# Patient Record
Sex: Female | Born: 1995
Health system: Southern US, Community
[De-identification: ages and names within clinical notes are randomized; demographics above are authoritative.]

## PROBLEM LIST (undated history)

## (undated) DIAGNOSIS — G43909 Migraine, unspecified, not intractable, without status migrainosus: Secondary | ICD-10-CM

## (undated) DIAGNOSIS — M5416 Radiculopathy, lumbar region: Secondary | ICD-10-CM

## (undated) DIAGNOSIS — G8929 Other chronic pain: Secondary | ICD-10-CM

## (undated) DIAGNOSIS — E559 Vitamin D deficiency, unspecified: Secondary | ICD-10-CM

## (undated) HISTORY — DX: Other chronic pain: G89.29

## (undated) HISTORY — DX: Radiculopathy, lumbar region: M54.16

## (undated) HISTORY — DX: Vitamin D deficiency, unspecified: E55.9

---
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
## 2017-01-19 ENCOUNTER — Emergency Department (HOSPITAL_COMMUNITY)
Admission: EM | Admit: 2017-01-19 | Discharge: 2017-01-19 | Disposition: A | Discharge status: Home / Self Care | Payer: 59 | Attending: Emergency Medicine | Admitting: Emergency Medicine

## 2017-01-19 ENCOUNTER — Emergency Department (HOSPITAL_COMMUNITY): Payer: 59

## 2017-01-19 ENCOUNTER — Encounter (HOSPITAL_COMMUNITY): Payer: Self-pay | Admitting: Emergency Medicine

## 2017-01-19 DIAGNOSIS — M545 Low back pain, unspecified: Secondary | ICD-10-CM

## 2017-01-19 DIAGNOSIS — N390 Urinary tract infection, site not specified: Secondary | ICD-10-CM | POA: Insufficient documentation

## 2017-01-19 DIAGNOSIS — Z975 Presence of (intrauterine) contraceptive device: Secondary | ICD-10-CM | POA: Diagnosis not present

## 2017-01-19 DIAGNOSIS — R1032 Left lower quadrant pain: Secondary | ICD-10-CM

## 2017-01-19 DIAGNOSIS — R102 Pelvic and perineal pain: Secondary | ICD-10-CM

## 2017-01-19 LAB — I-STAT BETA HCG BLOOD, ED (MC, WL, AP ONLY): I-stat hCG, quantitative: <5 m[IU]/mL (ref <5)

## 2017-01-19 LAB — URINALYSIS, ROUTINE W REFLEX MICROSCOPIC
BILIRUBIN URINE: NEGATIVE
GLUCOSE, UA: NEGATIVE mg/dL
Ketones, ur: 5 mg/dL — AB
Nitrite: NEGATIVE
PROTEIN: NEGATIVE mg/dL
Specific Gravity, Urine: 1.018 (ref 1.005–1.030)
pH: 5 (ref 5.0–8.0)

## 2017-01-19 LAB — CBC
HEMATOCRIT: 45.7 % (ref 36.0–46.0)
HEMOGLOBIN: 16.5 g/dL — AB (ref 12.0–15.0)
MCH: 33.5 pg (ref 26.0–34.0)
MCHC: 36.1 g/dL — AB (ref 30.0–36.0)
MCV: 92.9 fL (ref 78.0–100.0)
Platelets: 293 10*3/uL (ref 150–400)
RBC: 4.92 MIL/uL (ref 3.87–5.11)
RDW: 12.1 % (ref 11.5–15.5)
WBC: 10.4 10*3/uL (ref 4.0–10.5)

## 2017-01-19 LAB — COMPREHENSIVE METABOLIC PANEL
ALBUMIN: 5 g/dL (ref 3.5–5.0)
ALK PHOS: 85 U/L (ref 38–126)
ALT: 24 U/L (ref 14–54)
ANION GAP: 8 (ref 5–15)
AST: 28 U/L (ref 15–41)
BILIRUBIN TOTAL: 1.2 mg/dL (ref 0.3–1.2)
BUN: 12 mg/dL (ref 6–20)
CALCIUM: 9.6 mg/dL (ref 8.9–10.3)
CO2: 23 mmol/L (ref 22–32)
Chloride: 106 mmol/L (ref 101–111)
Creatinine, Ser: 0.79 mg/dL (ref 0.44–1.00)
GFR calc Af Amer: >60 mL/min (ref >60)
GFR calc non Af Amer: >60 mL/min (ref >60)
GLUCOSE: 84 mg/dL (ref 65–99)
POTASSIUM: 3.8 mmol/L (ref 3.5–5.1)
Sodium: 137 mmol/L (ref 135–145)
TOTAL PROTEIN: 8.2 g/dL — AB (ref 6.5–8.1)

## 2017-01-19 LAB — LIPASE, BLOOD: Lipase: 23 U/L (ref 11–51)

## 2017-01-19 LAB — POC OCCULT BLOOD, ED: Fecal Occult Bld: NEGATIVE

## 2017-01-19 MED ORDER — SODIUM CHLORIDE 0.9 % IV BOLUS (SEPSIS)
1000.0000 mL | Freq: Once | INTRAVENOUS | Status: AC
  Administered 2017-01-19: 1000 mL via INTRAVENOUS

## 2017-01-19 MED ORDER — FENTANYL CITRATE (PF) 100 MCG/2ML IJ SOLN
50.0000 ug | Freq: Once | INTRAMUSCULAR | Status: AC
Start: 2017-01-19 — End: 2017-01-19
  Administered 2017-01-19: 50 ug via INTRAVENOUS
  Filled 2017-01-19: qty 2

## 2017-01-19 MED ORDER — CEPHALEXIN 500 MG PO CAPS: 500.0000 mg | ORAL_CAPSULE | Freq: Two times a day (BID) | ORAL | 0 refills | Status: AC

## 2017-01-19 NOTE — ED Notes (Signed)
US at bedside

## 2017-01-19 NOTE — ED Provider Notes (Signed)
[facility]-EMERGENCY DEPT Provider Note   CSN: [id number] Arrival date & time: 01/19/17  0740     History   Chief Complaint Chief Complaint  Patient presents with  . Abdominal Pain  . Back Pain    HPI Buck MamJasmine Goldwasser is a 22 y.o. female with no significant past medical history presenting with progressive onset worsening suprapubic/left lower quadrant pain for the last 48 hours.  Patient reports intermittent pain denies any aggravating or alleviating factors.  She reports associated intermittent sharp lower left back pain. Patient had been constipated for a couple days prior and has tried Epson salt with relief of constipation but continues to experience pain.  She since has had multiple episodes of soft stools and noted blood on the tissue.  No blood mixed with the stool or in the toilet bowl.  Denies any fever, nausea, vomiting she does report chills.  LMP was at the beginning of November and was the first in 2 years.  Has had IUD for the last 3 years.  She denies vaginal discharge, pain, bleeding or abnormal odor, dysuria, hematuria, frequency or urgency. No history of kidney stone or ovarian cyst.  HPI  Past Medical History:  Diagnosis Date  . Migraine     There are no active problems to display for this patient.   History reviewed. No pertinent surgical history.  OB History    No data available       Home Medications    Prior to Admission medications   Medication Sig Start Date End Date Taking? Authorizing Provider  ibuprofen (ADVIL,MOTRIN) 200 MG tablet Take 400 mg by mouth daily as needed.   Yes [provider]  cephALEXin (KEFLEX) 500 MG capsule Take 1 capsule (500 mg total) by mouth 2 (two) times daily for 7 days. 01/19/17 01/26/17  Georgiana ShoreMitchell,  B, PA-C    Family History No family history on file.  Social History Social History   Tobacco Use  . Smoking status: Never Smoker  Substance Use Topics  . Alcohol use: No  . Drug  use: Not on file     Allergies   Patient has no known allergies.   Review of Systems Review of Systems  Constitutional: Positive for chills. Negative for fatigue and fever.  Respiratory: Negative for chest tightness and shortness of breath.   Cardiovascular: Negative for chest pain and palpitations.  Gastrointestinal: Positive for abdominal pain, blood in stool and constipation. Negative for rectal pain.       Resolved constipation. Blood on tissue only  Genitourinary: Negative for decreased urine volume, difficulty urinating, dysuria, flank pain, frequency, hematuria, urgency, vaginal bleeding, vaginal discharge and vaginal pain.  Musculoskeletal: Positive for back pain. Negative for myalgias, neck pain and neck stiffness.  Skin: Negative for color change, pallor and rash.  Neurological: Negative for syncope, weakness and headaches.     Physical Exam Updated Vital Signs BP 133/84 (BP Location: Left Arm)   Pulse 77   Temp 98.4 F (36.9 C) (Oral)   Resp 18   SpO2 100%   Physical Exam  Constitutional: She appears well-developed and well-nourished.  Non-toxic appearance. She does not appear ill. No distress.  Afebrile, nontoxic-appearing, lying comfortably in bed in no acute distress.  HENT:  Head: Normocephalic and atraumatic.  Eyes: Conjunctivae are normal.  Neck: Neck supple.  Cardiovascular: Normal rate and regular rhythm.  No murmur heard. Pulmonary/Chest: Effort normal and breath sounds normal. No stridor. No respiratory distress. She has no wheezes. She  has no rales.  Abdominal: Soft. Normal appearance and bowel sounds are normal. She exhibits no distension and no mass. There is tenderness in the suprapubic area and left lower quadrant. There is no rigidity, no rebound, no guarding, no CVA tenderness and no tenderness at McBurney's point.  Patient has discomfort with palpation of suprapubic area and adjacent left lower quadrant  Genitourinary:  Genitourinary Comments:  No hemorrhoid or fissure noted on exam.  hemoccult negative  Musculoskeletal: She exhibits no edema.  Neurological: She is alert.  Skin: Skin is warm and dry. No rash noted. She is not diaphoretic. No cyanosis or erythema. No pallor.  Psychiatric: She has a normal mood and affect.  Nursing note and vitals reviewed.    ED Treatments / Results  Labs (all labs ordered are listed, but only abnormal results are displayed) Labs Reviewed  COMPREHENSIVE METABOLIC PANEL - Abnormal; Notable for the following components:      Result Value   Total Protein 8.2 (*)    All other components within normal limits  CBC - Abnormal; Notable for the following components:   Hemoglobin 16.5 (*)    MCHC 36.1 (*)    All other components within normal limits  URINALYSIS, ROUTINE W REFLEX MICROSCOPIC - Abnormal; Notable for the following components:   APPearance HAZY (*)    Hgb urine dipstick MODERATE (*)    Ketones, ur 5 (*)    Leukocytes, UA MODERATE (*)    Bacteria, UA RARE (*)    Squamous Epithelial / LPF 6-30 (*)    All other components within normal limits  URINE CULTURE  LIPASE, BLOOD  I-STAT BETA HCG BLOOD, ED ([facility], [facility], [facility] ONLY)  POC OCCULT BLOOD, ED    EKG  EKG Interpretation None       Radiology US Transvaginal Non-ob  Result Date: 01/19/2017 CLINICAL DATA:  Initial evaluation for acute left lower quadrant pain for 2 days. EXAM: TRANSABDOMINAL AND TRANSVAGINAL ULTRASOUND OF PELVIS DOPPLER ULTRASOUND OF OVARIES TECHNIQUE: Both transabdominal and transvaginal ultrasound examinations of the pelvis were performed. Transabdominal technique was performed for global imaging of the pelvis including uterus, ovaries, adnexal regions, and pelvic cul-de-sac. It was necessary to proceed with endovaginal exam following the transabdominal exam to visualize the uterus and ovaries. Color and duplex Doppler ultrasound was utilized to evaluate blood flow to the ovaries. COMPARISON:  None. FINDINGS: Uterus  Measurements: 7.2 x 3.9 x 4.7 cm. No fibroids or other mass visualized. Endometrium Thickness: 12.9 mm. No focal abnormality visualized. IUD in appropriate position within the endometrial canal. Right ovary Measurements: 5.2 x 2.6 x 3.6 cm. Right ovarian corpus luteal cyst noted. Otherwise unremarkable without adnexal mass. Left ovary Measurements: 4.2 x 2.0 x 2.5 cm. Normal appearance/no adnexal mass. Pulsed Doppler evaluation of both ovaries demonstrates normal low-resistance arterial and venous waveforms. Other findings Small volume free fluid within the cul-de-sac and right adnexa. IMPRESSION: 1. Right ovarian corpus luteal cyst with small volume free physiologic fluid within the pelvis. 2. Otherwise unremarkable pelvic ultrasound. No other acute abnormality identified. Electronically Signed   By: Rise Mu M.D.   On: 01/19/2017 18:00   US Pelvis Complete  Result Date: 01/19/2017 CLINICAL DATA:  Initial evaluation for acute left lower quadrant pain for 2 days. EXAM: TRANSABDOMINAL AND TRANSVAGINAL ULTRASOUND OF PELVIS DOPPLER ULTRASOUND OF OVARIES TECHNIQUE: Both transabdominal and transvaginal ultrasound examinations of the pelvis were performed. Transabdominal technique was performed for global imaging of the pelvis including uterus, ovaries, adnexal regions, and pelvic cul-de-sac.  It was necessary to proceed with endovaginal exam following the transabdominal exam to visualize the uterus and ovaries. Color and duplex Doppler ultrasound was utilized to evaluate blood flow to the ovaries. COMPARISON:  None. FINDINGS: Uterus Measurements: 7.2 x 3.9 x 4.7 cm. No fibroids or other mass visualized. Endometrium Thickness: 12.9 mm. No focal abnormality visualized. IUD in appropriate position within the endometrial canal. Right ovary Measurements: 5.2 x 2.6 x 3.6 cm. Right ovarian corpus luteal cyst noted. Otherwise unremarkable without adnexal mass. Left ovary Measurements: 4.2 x 2.0 x 2.5 cm. Normal  appearance/no adnexal mass. Pulsed Doppler evaluation of both ovaries demonstrates normal low-resistance arterial and venous waveforms. Other findings Small volume free fluid within the cul-de-sac and right adnexa. IMPRESSION: 1. Right ovarian corpus luteal cyst with small volume free physiologic fluid within the pelvis. 2. Otherwise unremarkable pelvic ultrasound. No other acute abnormality identified. Electronically Signed   By: Rise Mu M.D.   On: 01/19/2017 18:00   [place] Art/ven Flow Abd Pelv Doppler  Result Date: 01/19/2017 CLINICAL DATA:  Initial evaluation for acute left lower quadrant pain for 2 days. EXAM: TRANSABDOMINAL AND TRANSVAGINAL ULTRASOUND OF PELVIS DOPPLER ULTRASOUND OF OVARIES TECHNIQUE: Both transabdominal and transvaginal ultrasound examinations of the pelvis were performed. Transabdominal technique was performed for global imaging of the pelvis including uterus, ovaries, adnexal regions, and pelvic cul-de-sac. It was necessary to proceed with endovaginal exam following the transabdominal exam to visualize the uterus and ovaries. Color and duplex Doppler ultrasound was utilized to evaluate blood flow to the ovaries. COMPARISON:  None. FINDINGS: Uterus Measurements: 7.2 x 3.9 x 4.7 cm. No fibroids or other mass visualized. Endometrium Thickness: 12.9 mm. No focal abnormality visualized. IUD in appropriate position within the endometrial canal. Right ovary Measurements: 5.2 x 2.6 x 3.6 cm. Right ovarian corpus luteal cyst noted. Otherwise unremarkable without adnexal mass. Left ovary Measurements: 4.2 x 2.0 x 2.5 cm. Normal appearance/no adnexal mass. Pulsed Doppler evaluation of both ovaries demonstrates normal low-resistance arterial and venous waveforms. Other findings Small volume free fluid within the cul-de-sac and right adnexa. IMPRESSION: 1. Right ovarian corpus luteal cyst with small volume free physiologic fluid within the pelvis. 2. Otherwise unremarkable pelvic ultrasound.  No other acute abnormality identified. Electronically Signed   By: Rise Mu M.D.   On: 01/19/2017 18:00    Procedures Procedures (including critical care time)  Medications Ordered in ED Medications  sodium chloride 0.9 % bolus 1,000 mL (0 mLs Intravenous Stopped 01/19/17 1633)  fentaNYL (SUBLIMAZE) injection 50 mcg (50 mcg Intravenous Given 01/19/17 1416)     Initial Impression / Assessment and Plan / ED Course  I have reviewed the triage vital signs and the nursing notes.  Pertinent labs & imaging results that were available during my care of the patient were reviewed by me and considered in my medical decision making (see chart for details).    Presenting with 48 hours of progressively worsening suprapubic/left lower quadrant discomfort with associated left-sided back pain.  Had been experiencing constipation for couple days prior which has been relieved with drinking Epsom salt. Back pain is not reproducible.  no CVA tenderness. She is well-appearing, nontoxic and afebrile with normal vital signs.  On exam she has discomfort to palpation of the suprapubic area and left lower quadrant.  UA with moderate leukocytes and some bacteria.  Ordered culture Labs pending Will give IV fluids and provide analgesia and reassess  On reassessment, patient reported significant improvement and was pain-free.  Although she does  states that her pain has been like a stabbing sharp pain that comes and goes. Will order ultrasound to rule out intermittent torsion.  Labs unremarkable, Hemoccult negative.  Ultrasound negative with the exception of a right luteal ovarian cyst.  Patient improved while in the emergency department, normal vital signs, afebrile nontoxic.   Discharge home treatment for UTI and close follow-up with PCP.  Discussed strict return precautions and advised to return to the emergency department if experiencing any new or worsening symptoms. Instructions were understood  and patient agreed with discharge plan.  Final Clinical Impressions(s) / ED Diagnoses   Final diagnoses:  Lower urinary tract infectious disease    ED Discharge Orders        Ordered    cephALEXin (KEFLEX) 500 MG capsule  2 times daily     01/19/17 1832       Gregary Cromer 01/19/17 Verdie Shire, MD 01/20/17 [phone]

## 2017-01-19 NOTE — Discharge Instructions (Signed)
As discussed, take your entire course of antibiotics even if you feel better, stay well-hydrated keeping your urine clear. Follow up with your primary care provider.  Return if symptoms worsen, worsening pain, fever, chills, nausea vomiting diarrhea or new concerning symptoms in the meantime

## 2017-01-19 NOTE — ED Triage Notes (Signed)
Patient c/o lower abd pain and lower back pain for couple days. Reports she was constipated and drank Epsom salt which helped have BM but still having abd pains. Denies any urinary problems.

## 2017-01-21 LAB — URINE CULTURE

## 2018-03-06 IMAGING — US US TRANSVAGINAL NON-OB
1 series · 13 of 25 positions shown · non-contrast
Comparison: None.

CLINICAL DATA: Initial evaluation for acute left lower quadrant
pain for 2 days.

EXAM:
TRANSABDOMINAL AND TRANSVAGINAL ULTRASOUND OF PELVIS
DOPPLER ULTRASOUND OF OVARIES
TECHNIQUE: Both transabdominal and transvaginal ultrasound examinations of the
pelvis were performed. Transabdominal technique was performed for
global imaging of the pelvis including uterus, ovaries, adnexal
regions, and pelvic cul-de-sac.
It was necessary to proceed with endovaginal exam following the
transabdominal exam to visualize the uterus and ovaries. Color and
duplex Doppler ultrasound was utilized to evaluate blood flow to the
ovaries.

[Series 1: us transvaginal non-ob · 0.22mm/px · 13 of 114 slices shown]
[im 1/114]
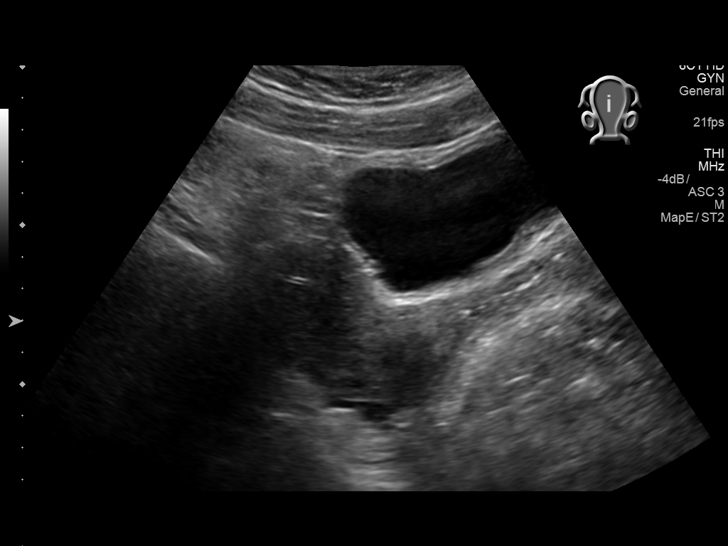
[im 10/114]
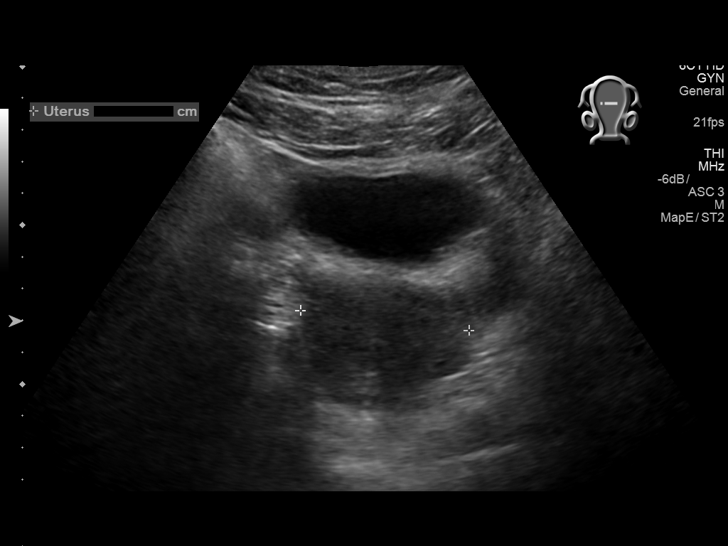
[im 19/114]
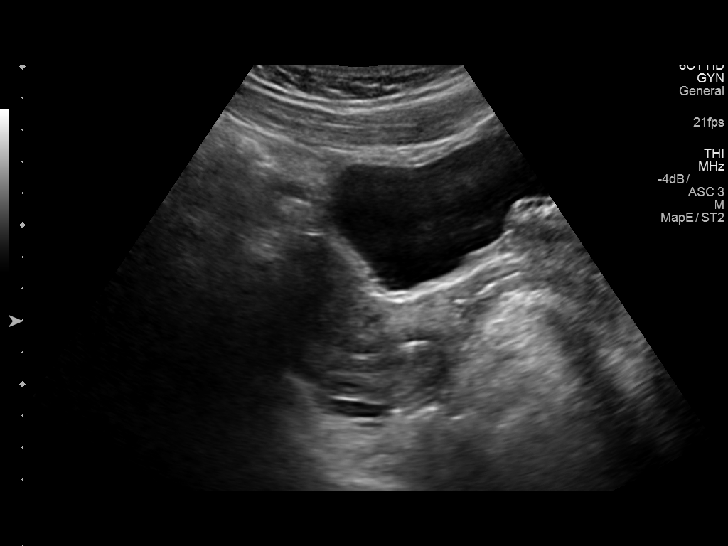
[im 29/114]
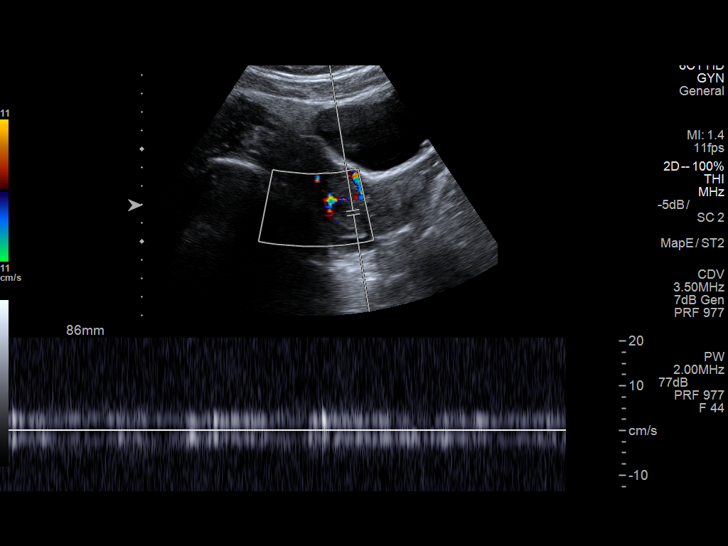
[im 38/114]
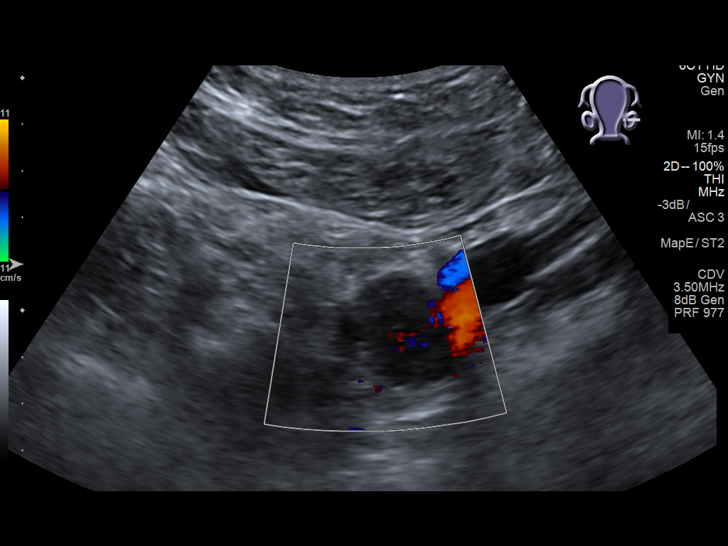
[im 48/114]
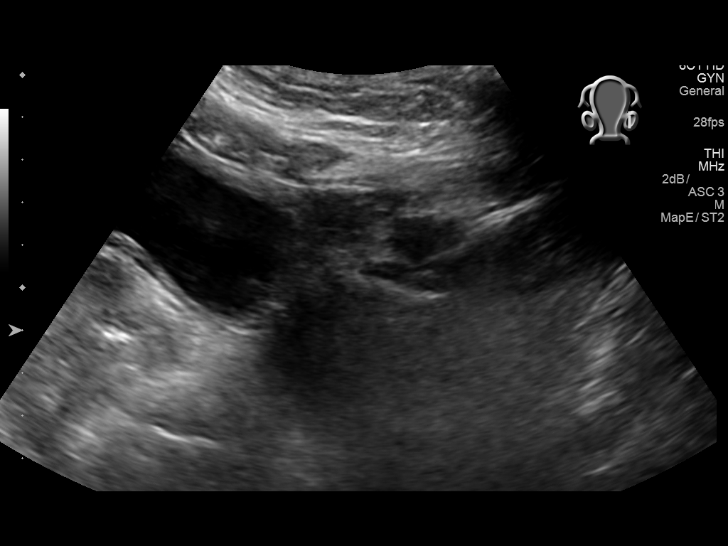
[im 57/114]
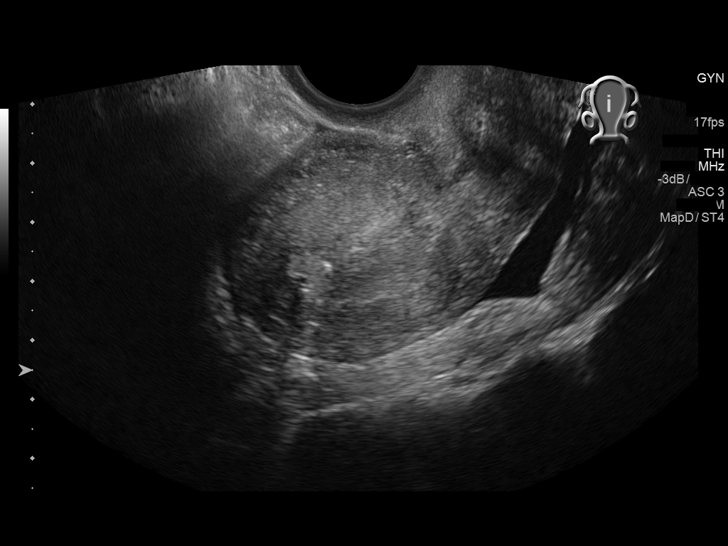
[im 66/114]
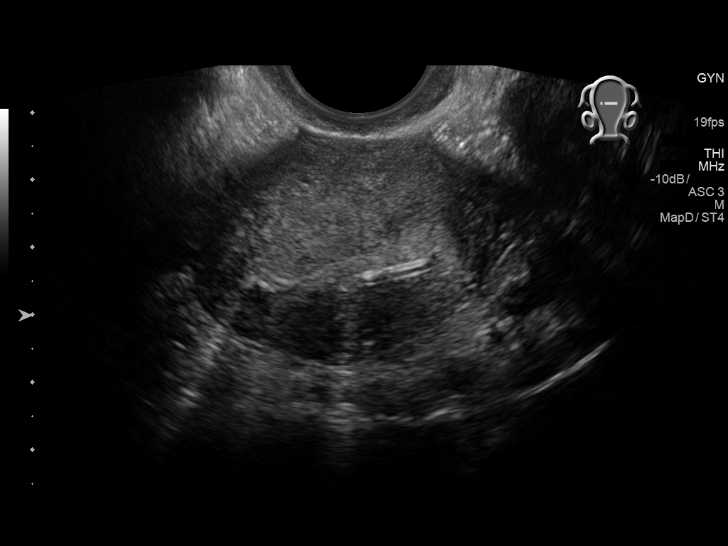
[im 76/114]
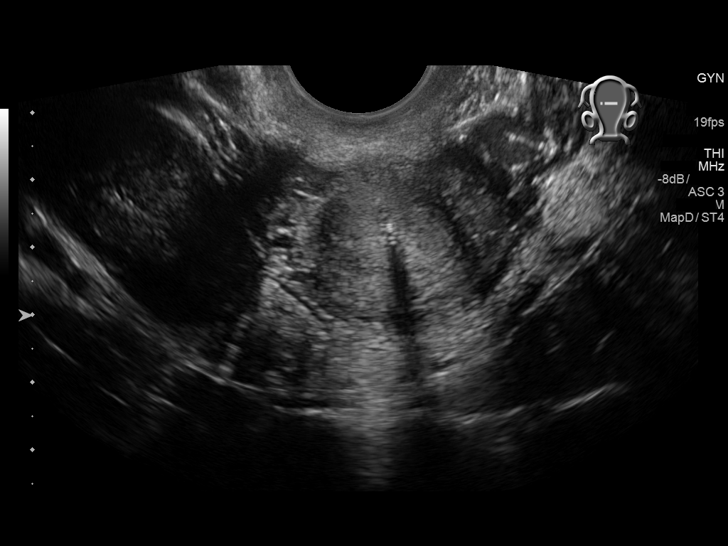
[im 85/114]
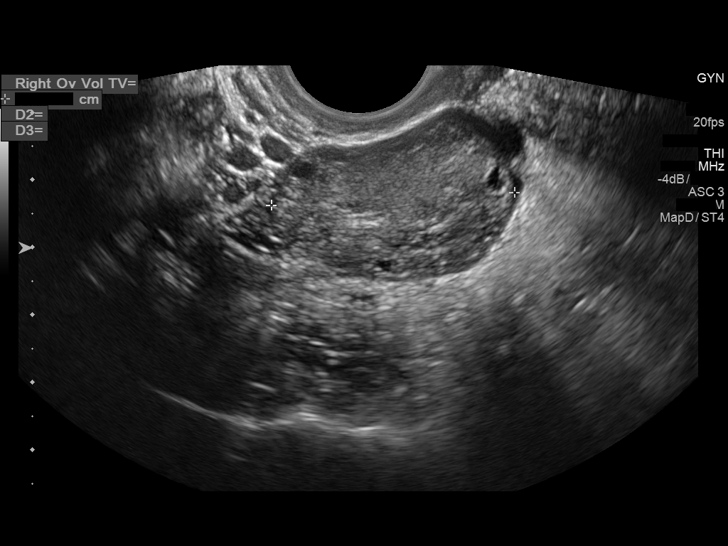
[im 95/114]
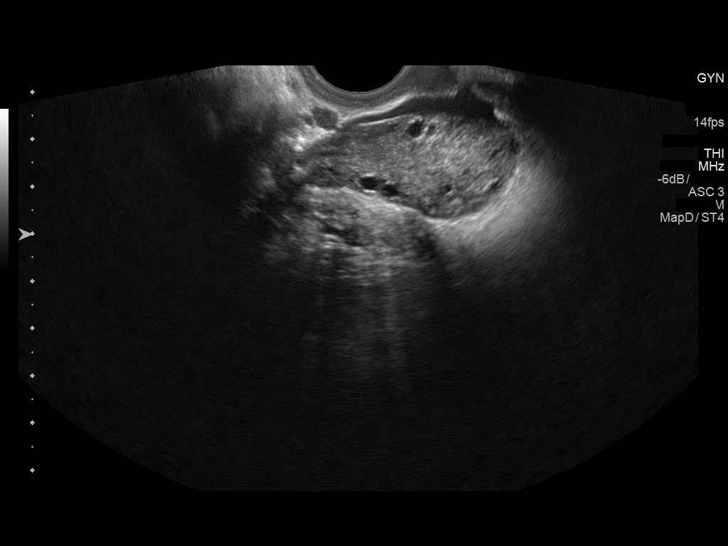
[im 104/114]
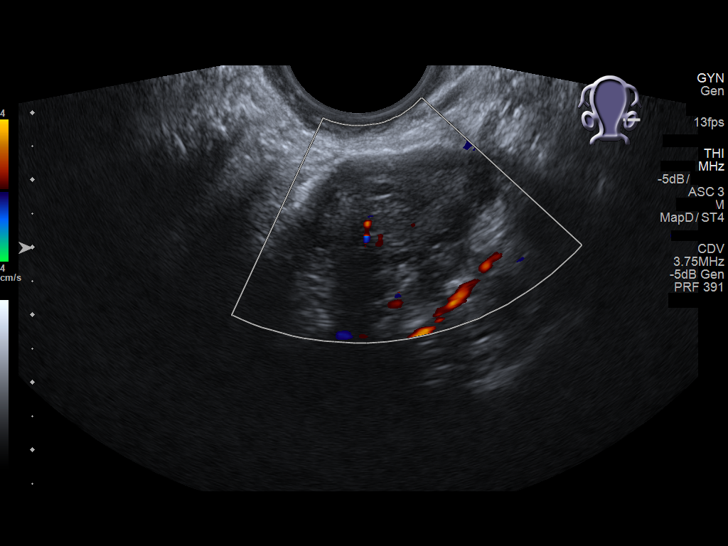
[im 114/114]
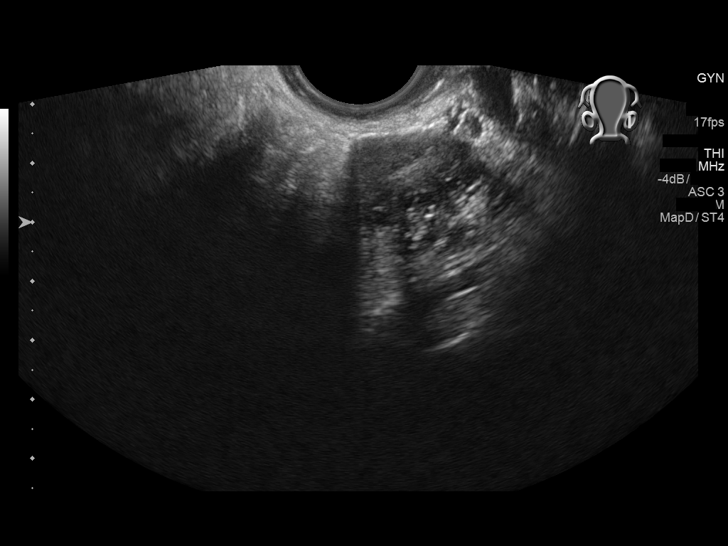

[13 of 25 positions shown; findings below may reference images not displayed]

FINDINGS: Uterus

Measurements: 7.2 x 3.9 x 4.7 cm. No fibroids or other mass
visualized.

Endometrium

Thickness: 12.9 mm. No focal abnormality visualized. IUD in
appropriate position within the endometrial canal.

Right ovary

Measurements: 5.2 x 2.6 x 3.6 cm. Right ovarian corpus luteal cyst
noted. Otherwise unremarkable without adnexal mass.

Left ovary

Measurements: 4.2 x 2.0 x 2.5 cm. Normal appearance/no adnexal mass.

Pulsed Doppler evaluation of both ovaries demonstrates normal
low-resistance arterial and venous waveforms.

Other findings

Small volume free fluid within the cul-de-sac and right adnexa.
IMPRESSION: 1. Right ovarian corpus luteal cyst with small volume free
physiologic fluid within the pelvis.
2. Otherwise unremarkable pelvic ultrasound. No other acute
abnormality identified.

## 2018-10-16 ENCOUNTER — Emergency Department (HOSPITAL_COMMUNITY)
Admission: EM | Admit: 2018-10-16 | Discharge: 2018-10-16 | Disposition: A | Discharge status: Home / Self Care | Payer: 59 | Attending: Emergency Medicine | Admitting: Emergency Medicine

## 2018-10-16 ENCOUNTER — Other Ambulatory Visit: Payer: Self-pay

## 2018-10-16 DIAGNOSIS — N760 Acute vaginitis: Secondary | ICD-10-CM | POA: Insufficient documentation

## 2018-10-16 DIAGNOSIS — B9689 Other specified bacterial agents as the cause of diseases classified elsewhere: Secondary | ICD-10-CM | POA: Diagnosis not present

## 2018-10-16 DIAGNOSIS — R102 Pelvic and perineal pain: Secondary | ICD-10-CM | POA: Diagnosis present

## 2018-10-16 LAB — URINALYSIS, ROUTINE W REFLEX MICROSCOPIC
Bilirubin Urine: NEGATIVE
Glucose, UA: NEGATIVE mg/dL
Ketones, ur: NEGATIVE mg/dL
Leukocytes,Ua: NEGATIVE
Nitrite: NEGATIVE
Protein, ur: NEGATIVE mg/dL
Specific Gravity, Urine: 1.019 (ref 1.005–1.030)
pH: 5 (ref 5.0–8.0)

## 2018-10-16 LAB — WET PREP, GENITAL
Sperm: NONE SEEN
Trich, Wet Prep: NONE SEEN

## 2018-10-16 LAB — POC URINE PREG, ED: Preg Test, Ur: NEGATIVE

## 2018-10-16 MED ORDER — NAPROXEN 500 MG PO TABS: 500.0000 mg | ORAL_TABLET | Freq: Two times a day (BID) | ORAL | 0 refills | Status: DC

## 2018-10-16 MED ORDER — KETOROLAC TROMETHAMINE 30 MG/ML IJ SOLN
30.0000 mg | Freq: Once | INTRAMUSCULAR | Status: AC
  Administered 2018-10-16: 30 mg via INTRAMUSCULAR
  Filled 2018-10-16: qty 1

## 2018-10-16 MED ORDER — METRONIDAZOLE 500 MG PO TABS: 500.0000 mg | ORAL_TABLET | Freq: Two times a day (BID) | ORAL | 0 refills | Status: DC

## 2018-10-16 NOTE — ED Triage Notes (Addendum)
Pt states she woke up with pelvic pain this morning. Hx of dysplasia and pt thinks this feels like similar pain. Pain has gotten better since it started. Has some pain with urination. Also endorses some vaginal discharge this morning but unsure what color.

## 2018-10-16 NOTE — ED Provider Notes (Signed)
[facility] EMERGENCY DEPARTMENT Provider Note   CSN: [id number] Arrival date & time: 10/16/18  3244     History   Chief Complaint Chief Complaint  Patient presents with  . Pelvic Pain    HPI Rebecca Fox is a 23 y.o. female with a past medical history of cervical dysplasia presents to ED for evaluation of pelvic pain for the past several hours.  States that she woke up this morning with stabbing pelvic pain and slight vaginal discharge.  She has not tried any over-the-counter medications to help with her symptoms.  She states the pain woke her up from her sleep.  This feels somewhat similar to the time that she was diagnosed with dysplasia.  States that she had a procedure done to remove the dysplasia about 1 year ago.  She denies concern for STDs, abdominal pain, vomiting, changes to bowel movements, fever or abnormal vaginal bleeding.  States that she has a 5-year IUD for the past 4 years.  Reports some dysuria as well.     HPI  Past Medical History:  Diagnosis Date  . Migraine     There are no active problems to display for this patient.   No past surgical history on file.   OB History   No obstetric history on file.      Home Medications    Prior to Admission medications   Medication Sig Start Date End Date Taking? Authorizing Provider  metroNIDAZOLE (FLAGYL) 500 MG tablet Take 1 tablet (500 mg total) by mouth 2 (two) times daily. 10/16/18   , , PA-C  naproxen (NAPROSYN) 500 MG tablet Take 1 tablet (500 mg total) by mouth 2 (two) times daily. 10/16/18   Dietrich Pates, PA-C    Family History No family history on file.  Social History Social History   Tobacco Use  . Smoking status: Never Smoker  Substance Use Topics  . Alcohol use: No  . Drug use: Not on file     Allergies   Patient has no known allergies.   Review of Systems Review of Systems  Constitutional: Negative for appetite change, chills and fever.  HENT:  Negative for ear pain, rhinorrhea, sneezing and sore throat.   Eyes: Negative for photophobia and visual disturbance.  Respiratory: Negative for cough, chest tightness, shortness of breath and wheezing.   Cardiovascular: Negative for chest pain and palpitations.  Gastrointestinal: Negative for abdominal pain, blood in stool, constipation, diarrhea, nausea and vomiting.  Genitourinary: Positive for dysuria, pelvic pain and vaginal discharge. Negative for hematuria and urgency.  Musculoskeletal: Negative for myalgias.  Skin: Negative for rash.  Neurological: Negative for dizziness, weakness and light-headedness.     Physical Exam Updated Vital Signs BP 115/64 (BP Location: Right Arm)   Pulse 69   Temp 97.7 F (36.5 C) (Oral)   Resp 18   SpO2 100%   Physical Exam Vitals signs and nursing note reviewed. Exam conducted with a chaperone present.  Constitutional:      General: She is not in acute distress.    Appearance: She is well-developed.  HENT:     Head: Normocephalic and atraumatic.     Nose: Nose normal.  Eyes:     General: No scleral icterus.       Right eye: No discharge.        Left eye: No discharge.     Conjunctiva/sclera: Conjunctivae normal.  Neck:     Musculoskeletal: Normal range of motion and neck supple.  Cardiovascular:  Rate and Rhythm: Normal rate and regular rhythm.     Heart sounds: Normal heart sounds. No murmur. No friction rub. No gallop.   Pulmonary:     Effort: Pulmonary effort is normal. No respiratory distress.     Breath sounds: Normal breath sounds.  Abdominal:     General: Bowel sounds are normal. There is no distension.     Palpations: Abdomen is soft.     Tenderness: There is no abdominal tenderness. There is no guarding.  Genitourinary:    Vagina: Vaginal discharge present. No tenderness.     Cervix: No friability or erythema.     Adnexa:        Right: No tenderness.         Left: No tenderness.       Comments: Pelvic exam: normal  external genitalia without evidence of trauma. VULVA: normal appearing vulva with no masses, tenderness or lesion. VAGINA: normal appearing vagina with normal color and thick white vaginal discharge noted. CERVIX: normal appearing cervix without lesions, cervical motion tenderness absent, cervical os closed with out purulent discharge; Wet prep and DNA probe for chlamydia and GC obtained.   ADNEXA: normal adnexa in size, nontender and no masses UTERUS: uterus is normal size, shape, consistency and nontender.   Musculoskeletal: Normal range of motion.  Skin:    General: Skin is warm and dry.     Findings: No rash.  Neurological:     Mental Status: She is alert.     Motor: No abnormal muscle tone.     Coordination: Coordination normal.      ED Treatments / Results  Labs (all labs ordered are listed, but only abnormal results are displayed) Labs Reviewed  WET PREP, GENITAL - Abnormal; Notable for the following components:      Result Value   Yeast Wet Prep HPF POC   (*)    Value: Swab received with less than 0.5 mL of saline, saline added to specimen, interpret results with caution.   Clue Cells Wet Prep HPF POC PRESENT (*)    WBC, Wet Prep HPF POC MODERATE (*)    All other components within normal limits  URINALYSIS, ROUTINE W REFLEX MICROSCOPIC - Abnormal; Notable for the following components:   APPearance HAZY (*)    Hgb urine dipstick SMALL (*)    Bacteria, UA RARE (*)    All other components within normal limits  POC URINE PREG, ED  GC/CHLAMYDIA PROBE AMP ([place]) NOT AT [facility]    EKG None  Radiology No results found.  Procedures Procedures (including critical care time)  Medications Ordered in ED Medications  ketorolac (TORADOL) 30 MG/ML injection 30 mg (30 mg Intramuscular Given 10/16/18 0945)     Initial Impression / Assessment and Plan / ED Course  I have reviewed the triage vital signs and the nursing notes.  Pertinent labs & imaging results that were  available during my care of the patient were reviewed by me and considered in my medical decision making (see chart for details).        23 year old female presents to ED for pelvic pain and vaginal discharge.  Also reports some dysuria.  She had similar but more severe symptoms when she was told she had cervical dysplasia.  She states she had a procedure to have the dysplasia removed about 1 year ago.  She has had repeat Pap smears since then that have been unremarkable.  She has not tried medications today to help with her  symptoms.  Symptoms began today.  Pelvic exam revealed thick white vaginal discharge without any cervical motion tenderness adnexal tenderness or uterine tenderness.  No apparent abnormalities noted on cervix today.  Lab work significant for wet prep showing clue cells.  Urinalysis with some bacteria but otherwise unremarkable.  Pregnancy test is negative.  GC chlamydia probe pending.  Patient was given Toradol with improvement in her symptoms.  Will discharge home with Flagyl, naproxen and OB/GYN follow-up.  Informed patient that we are unable to perform Pap smears here and that this may ultimately be needed due to her history.  Patient agreeable to the plan.  We will have her return for worsening symptoms.  Patient is hemodynamically stable, in NAD, and able to ambulate in the ED. Evaluation does not show pathology that would require ongoing emergent intervention or inpatient treatment. I explained the diagnosis to the patient. Pain has been managed and has no complaints prior to discharge. Patient is comfortable with above plan and is stable for discharge at this time. All questions were answered prior to disposition. Strict return precautions for returning to the ED were discussed. Encouraged follow up with PCP.   An After Visit Summary was printed and given to the patient.   Portions of this note were generated with Scientist, clinical (histocompatibility and immunogenetics)Dragon dictation software. Dictation errors may occur despite  best attempts at proofreading.   Final Clinical Impressions(s) / ED Diagnoses   Final diagnoses:  BV (bacterial vaginosis)    ED Discharge Orders         Ordered    naproxen (NAPROSYN) 500 MG tablet  2 times daily     10/16/18 1048    metroNIDAZOLE (FLAGYL) 500 MG tablet  2 times daily     10/16/18 [address]1048           , , PA-C 10/16/18 1050    Milagros Lollykstra, Richard S, MD 10/17/18 [phone]

## 2018-10-16 NOTE — Discharge Instructions (Signed)
Take the following medications to help with your pain.  Take the Flagyl to help with your infection. Return to the ED if you start to experience worsening symptoms, develop a fever with abdominal pain, inability to tolerate anything by mouth, shortness of breath. It is important for you to follow-up with your OB/GYN for a Pap smear.

## 2018-10-18 LAB — GC/CHLAMYDIA PROBE AMP (~~LOC~~) NOT AT ARMC
Chlamydia: NEGATIVE
Neisseria Gonorrhea: NEGATIVE

## 2019-06-02 ENCOUNTER — Ambulatory Visit (INDEPENDENT_AMBULATORY_CARE_PROVIDER_SITE_OTHER): Payer: 59 | Admitting: Family Medicine

## 2019-06-02 ENCOUNTER — Encounter: Payer: Self-pay | Admitting: Family Medicine

## 2019-06-02 ENCOUNTER — Other Ambulatory Visit: Payer: Self-pay

## 2019-06-02 VITALS — BP 112/88 | HR 86 | Ht 69.0 in | Wt 163.0 lb

## 2019-06-02 DIAGNOSIS — L659 Nonscarring hair loss, unspecified: Secondary | ICD-10-CM | POA: Diagnosis not present

## 2019-06-02 DIAGNOSIS — E559 Vitamin D deficiency, unspecified: Secondary | ICD-10-CM | POA: Diagnosis not present

## 2019-06-02 DIAGNOSIS — Z1321 Encounter for screening for nutritional disorder: Secondary | ICD-10-CM

## 2019-06-02 DIAGNOSIS — Z0000 Encounter for general adult medical examination without abnormal findings: Secondary | ICD-10-CM | POA: Diagnosis not present

## 2019-06-02 LAB — COMPREHENSIVE METABOLIC PANEL
ALT: 9 U/L (ref 0–35)
AST: 16 U/L (ref 0–37)
Albumin: 4.7 g/dL (ref 3.5–5.2)
Alkaline Phosphatase: 65 U/L (ref 39–117)
BUN: 18 mg/dL (ref 6–23)
CO2: 32 mEq/L (ref 19–32)
Calcium: 9.5 mg/dL (ref 8.4–10.5)
Chloride: 105 mEq/L (ref 96–112)
Creatinine, Ser: 0.87 mg/dL (ref 0.40–1.20)
GFR: 79.47 mL/min (ref >60.00)
Glucose, Bld: 83 mg/dL (ref 70–99)
Potassium: 4.1 mEq/L (ref 3.5–5.1)
Sodium: 138 mEq/L (ref 135–145)
Total Bilirubin: 0.6 mg/dL (ref 0.2–1.2)
Total Protein: 6.8 g/dL (ref 6.0–8.3)

## 2019-06-02 LAB — FERRITIN: Ferritin: 24.9 ng/mL (ref 10.0–291.0)

## 2019-06-02 LAB — VITAMIN D 25 HYDROXY (VIT D DEFICIENCY, FRACTURES): VITD: 15.98 ng/mL — ABNORMAL LOW (ref 30.00–100.00)

## 2019-06-02 LAB — CBC WITH DIFFERENTIAL/PLATELET
Basophils Absolute: 0 10*3/uL (ref 0.0–0.1)
Basophils Relative: 0.6 % (ref 0.0–3.0)
Eosinophils Absolute: 0.2 10*3/uL (ref 0.0–0.7)
Eosinophils Relative: 2.1 % (ref 0.0–5.0)
HCT: 42.7 % (ref 36.0–46.0)
Hemoglobin: 14.5 g/dL (ref 12.0–15.0)
Lymphocytes Relative: 50.1 % — ABNORMAL HIGH (ref 12.0–46.0)
Lymphs Abs: 3.7 10*3/uL (ref 0.7–4.0)
MCHC: 33.9 g/dL (ref 30.0–36.0)
MCV: 98.2 fl (ref 78.0–100.0)
Monocytes Absolute: 0.5 10*3/uL (ref 0.1–1.0)
Monocytes Relative: 6.3 % (ref 3.0–12.0)
Neutro Abs: 3 10*3/uL (ref 1.4–7.7)
Neutrophils Relative %: 40.9 % — ABNORMAL LOW (ref 43.0–77.0)
Platelets: 226 10*3/uL (ref 150.0–400.0)
RBC: 4.35 Mil/uL (ref 3.87–5.11)
RDW: 12.4 % (ref 11.5–15.5)
WBC: 7.3 10*3/uL (ref 4.0–10.5)

## 2019-06-02 LAB — TSH: TSH: 1.02 u[IU]/mL (ref 0.35–4.50)

## 2019-06-02 NOTE — Progress Notes (Signed)
Subjective:    Patient ID: Rebecca Fox, female    DOB: [birth year], 24 y.o.   MRN: [id number]  HPI Chief Complaint  Patient presents with  . New Patient (Initial Visit)    Physical --pt stated--bald spot requesting for dermatology.   This is a 24 yo female who presents today to establish care and requests CPE. Lives with her parents and younger brother. 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   Last CPE- sees Dr. Radene Knee, having IUD replaced on Monday.  Pap- Dr. Radene Knee, history of cervical dysplasia with LEEP 07/2017 Tdap-unsure Eye- never Dental- regular Exercise- goes to the gym Sleep- wakes up frequently. Sleeps about 5-6 hours a night.   Poor energy, poor appetite. For awhile. Feels like related to working so much.   Hair loss- Has noticed bald spot behind right ear, covers it with a braid. No itching, pain, rash. increased stress last fall. Doing better now.    Review of Systems  Constitutional: Positive for fatigue. Negative for unexpected weight change.  HENT: Negative.   Eyes: Negative.   Respiratory: Negative.   Cardiovascular: Negative.   Gastrointestinal: Negative.   Endocrine: Negative.   Genitourinary: Negative.   Musculoskeletal: Negative.   Skin:       Hair loss per HPI  Allergic/Immunologic: Negative.   Neurological: Negative.   Hematological: Negative.   Psychiatric/Behavioral: Negative.        Objective:   Physical Exam Vitals reviewed.  Constitutional:      General: She is not in acute distress.    Appearance: Normal appearance. She is normal weight. She is not ill-appearing, toxic-appearing or diaphoretic.  HENT:     Head: Normocephalic.     Right Ear: External ear normal.     Left Ear: External ear normal.     Nose: Nose normal.     Mouth/Throat:     Mouth: Mucous membranes are moist.     Pharynx: Oropharynx is clear.  Cardiovascular:     Rate and Rhythm: Normal rate and regular rhythm.     Heart sounds: Normal  heart sounds.  Pulmonary:     Effort: Pulmonary effort is normal.     Breath sounds: Normal breath sounds.  Abdominal:     General: Abdomen is flat. Bowel sounds are normal.     Palpations: Abdomen is soft.  Musculoskeletal:     Cervical back: Normal range of motion and neck supple. No rigidity or tenderness.     Right lower leg: No edema.     Left lower leg: No edema.  Lymphadenopathy:     Cervical: No cervical adenopathy.  Skin:    General: Skin is warm and dry.     Comments: Approximately 1.5 cm area hair loss above, behind right ear. No rash of scalp, no flaking. Patient with long locs.   Neurological:     Mental Status: She is alert and oriented to person, place, and time.  Psychiatric:        Mood and Affect: Mood normal.        Behavior: Behavior normal.        Thought Content: Thought content normal.        Judgment: Judgment normal.       BP 112/88   Pulse 86   Ht 5\' 9"  (1.753 m)   Wt 163 lb (73.9 kg)   SpO2 98%   BMI 24.07 kg/m  Depression screen [facility] 2/9 06/02/2019  Decreased Interest 0  Down, Depressed, Hopeless 0  PHQ - 2 Score 0  Altered sleeping 3  Tired, decreased energy 1  Change in appetite 1  Feeling bad or failure about yourself  3  Trouble concentrating 1  Moving slowly or fidgety/restless 0  Suicidal thoughts 0  PHQ-9 Score 9  Difficult doing work/chores Somewhat difficult       Assessment & Plan:  1. Annual physical exam - reviewed available records in EMR  2. Hair loss - TSH - CBC with Differential - Ferritin - Comprehensive metabolic panel - Ambulatory referral to Dermatology  3. Encounter for vitamin deficiency screening - Vitamin D, 25-hydroxy  This visit occurred during the SARS-CoV-2 public health emergency.  Safety protocols were in place, including screening questions prior to the visit, additional usage of staff PPE, and extensive cleaning of exam room while observing appropriate contact time as indicated for disinfecting  solutions.    Olean Ree, FNP-BC  [facility], [facility]  06/03/2019 7:19 AM

## 2019-06-02 NOTE — Patient Instructions (Signed)
Good to meet you today  Please work on decreasing your sugary drink intake as well as your nicotine  Continue to exercise regularly and work on stress reduction.

## 2019-06-03 ENCOUNTER — Encounter: Payer: Self-pay | Admitting: Family Medicine

## 2019-06-05 MED ORDER — VITAMIN D3 1.25 MG (50000 UT) PO TABS: 1.0000 | ORAL_TABLET | ORAL | 1 refills | Status: DC

## 2019-06-05 NOTE — Addendum Note (Signed)
Addended by: Olean Ree B on: 06/05/2019 07:02 AM   Modules accepted: Orders

## 2019-09-20 ENCOUNTER — Ambulatory Visit: Payer: 59 | Admitting: Physician Assistant

## 2019-09-20 ENCOUNTER — Other Ambulatory Visit: Payer: Self-pay

## 2019-09-20 ENCOUNTER — Encounter: Payer: Self-pay | Admitting: Physician Assistant

## 2019-09-20 DIAGNOSIS — L639 Alopecia areata, unspecified: Secondary | ICD-10-CM

## 2019-09-20 MED ORDER — CLOBETASOL PROPIONATE 0.05 % EX SOLN: 1.0000 "application " | Freq: Two times a day (BID) | CUTANEOUS | 2 refills | Status: DC

## 2019-09-20 NOTE — Progress Notes (Signed)
   New Patient Visit  Subjective  Rebecca Fox is a 25 y.o. female who presents for the following: Alopecia (bald spots x jan 2021 tx- none). She didn't realize that she was losing hair until the beginning of this year. She feels her hair stops growing at her shoulders and she does have bald spots. She feels like she is generally thinner along with the bald spots. No scalp symptoms. She has an IUD that she has had 5 years and recently had replaced. She feels that she has been stressed. She had a traumatic event last year that caused her to have to move from [facility] for a period of time.    Objective  Well appearing patient in no apparent distress; mood and affect are within normal limits.  A focused examination was performed including scalp, hair. Relevant physical exam findings are noted in the Assessment and Plan.  Objective  Scalp: 2 areas right scalp and top of scalp. Larger is on right. Total loss of hair In these areas but new growth is noted around the outer edge of both lesions.   Assessment & Plan  Alopecia areata Scalp  We discussed the option of topicals vs. Injectable steroid. She is interested in starting with topical and rechecking in 6-8 weeks.  Ordered Medications: clobetasol (TEMOVATE) 0.05 % external solution

## 2019-11-16 ENCOUNTER — Ambulatory Visit: Payer: 59 | Admitting: Physician Assistant

## 2020-08-07 ENCOUNTER — Encounter: Payer: Self-pay | Admitting: Family Medicine

## 2020-08-07 ENCOUNTER — Other Ambulatory Visit: Payer: Self-pay

## 2020-08-07 ENCOUNTER — Ambulatory Visit: Payer: 59 | Admitting: Family Medicine

## 2020-08-07 VITALS — BP 104/68 | HR 80 | Temp 98.1°F | Ht 68.25 in | Wt 158.0 lb

## 2020-08-07 DIAGNOSIS — G5701 Lesion of sciatic nerve, right lower limb: Secondary | ICD-10-CM

## 2020-08-07 NOTE — Progress Notes (Signed)
T. , MD, CAQ Sports Medicine [facility] at [facility] [address] [place], [address]  Phone: [phone]  FAX: [phone]  Rebecca Fox - 25 y.o. female  MRN [id number]  Date of Birth: 12-Jan-1996  Date: 08/07/2020  PCP: Emi Belfast, FNP  Referral: Emi Belfast, FNP  Chief Complaint  Patient presents with   Back Pain    Radiates down her right side through her hip to her knee and sometimes into ankle area     This visit occurred during the SARS-CoV-2 public health emergency.  Safety protocols were in place, including screening questions prior to the visit, additional usage of staff PPE, and extensive cleaning of exam room while observing appropriate contact time as indicated for disinfecting solutions.   Subjective:   Rebecca Fox is a 25 y.o. very pleasant female patient who presents with the following: Back Pain  ongoing for approximately: 6 weeks The patient has had back pain before. The back pain is localized into the l right upper buttocks region predominantly, and she does have some pain going down into her leg.   She does sit at least 5 hours for work, and she often drives every day as well.  R leg is causing her some pain.  Has been putting cream on it and on the lateral hip and buttocks area.  Will come on in the morning.   Does lashes. Irena Cords  + on the R  No bowel or bladder incontinence. No focal weakness. Prior interventions: None Physical therapy: No Chiropractic manipulations: No Acupuncture: No Osteopathic manipulation: No Heat or cold: Minimal effect   Review of Systems is noted in the HPI, as appropriate  Objective:   Blood pressure 104/68, pulse 80, temperature 98.1 F (36.7 C), temperature source Temporal, height 5' 8.25" (1.734 m), weight 158 lb (71.7 kg), SpO2 95 %.  GEN: No acute distress; alert,appropriate. PULM: Breathing comfortably in no respiratory distress PSYCH:  Normally interactive.  ABD: S, NT, ND, + BS, No rebound, No HSM   Range of motion at  the waist: Flexion, rotation and lateral bending: Forward flexion, extension, and all rotational and lateral movements are entirely normal.  No echymosis or edema Rises to examination table with no difficulty Gait: minimally antalgic  Inspection/Deformity: No abnormality Paraspinus T: Modest L4-S1, right greater than left  B Ankle Dorsiflexion (L5,4): 5/5 B Great Toe Dorsiflexion (L5,4): 5/5 Heel Walk (L5): WNL Toe Walk (S1): WNL Rise/Squat (L4): WNL, mild pain  SENSORY B Medial Foot (L4): WNL B Dorsum (L5): WNL B Lateral (S1): WNL Light Touch: WNL Pinprick: WNL  REFLEXES Knee (L4): 2+ Ankle (S1): 2+  B SLR, seated: neg B SLR, supine: neg B FABER: neg B Reverse FABER: Positive She does have tenderness on direct palpation to piriformis musculature on the right B Greater Troch: NT B Log Roll: neg B Stork: Minimally positive on the right B Sciatic Notch: NT  Radiology: No results found.  Assessment and Plan:     ICD-10-CM   1. Piriformis syndrome, right  G57.01      Classic.  Recommended that she get up once per hour to gently stretch of the hip and piriformis.  Using Netter's Orthopaedic Anatomy, reviewed with the patient the structures involved and how they related to diagnosis. The patient indicated understanding.   The patient was given a handout about classic piriformis stretching including [facility], Modified [facility] [facility], my self-described "Sink Stretch," and other piriformis  rehab.  We also reviewed hip flexor and abductor strengthening, ham stretching  Rec deep massage, explained self-massage with ball     Signed,   T. , MD   Outpatient Encounter Medications as of 08/07/2020  Medication Sig   [DISCONTINUED] Cholecalciferol (VITAMIN D3) 1.25 MG (50000 UT) TABS Take 1 tablet by mouth every 7 (seven) days. (Patient not taking: Reported on 09/20/2019)    [DISCONTINUED] clobetasol (TEMOVATE) 0.05 % external solution Apply 1 application topically 2 (two) times daily.   No facility-administered encounter medications on file as of 08/07/2020.

## 2020-08-07 NOTE — Patient Instructions (Signed)
PIRIFORMIS SYNDROME REHAB  Like the pictures on the handout.  1. Work on pretzel stretching, shoulder back and leg draped in front. 3-5 sets, 30 sec.. 2. hip abductor rotations. standing, hip flexion and rotation outward then inward. 3 sets, 15 reps. when can do comfortably, add ankle weights starting at 2 pounds.  3. cross over stretching - shoulder back to ground, same side leg crossover. 3-5 sets for 30 min..   4. SINK STRETCH - YOU CAN DO THIS WHENEVER YOU WANT DURING THE DAY  Tennis ball underneath area in buttocks - on a hard surface underneath  Can also massage this area with an Hydrologist or hand

## 2022-01-13 ENCOUNTER — Encounter: Payer: Self-pay | Admitting: Nurse Practitioner

## 2022-01-13 ENCOUNTER — Ambulatory Visit (HOSPITAL_COMMUNITY)
Admission: RE | Admit: 2022-01-13 | Discharge: 2022-01-13 | Disposition: A | Discharge status: Home / Self Care | Payer: BLUE CROSS/BLUE SHIELD | Source: Ambulatory Visit | Attending: Nurse Practitioner | Admitting: Nurse Practitioner

## 2022-01-13 ENCOUNTER — Ambulatory Visit (INDEPENDENT_AMBULATORY_CARE_PROVIDER_SITE_OTHER): Payer: BLUE CROSS/BLUE SHIELD | Admitting: Nurse Practitioner

## 2022-01-13 VITALS — BP 114/64 | HR 72 | Temp 97.7°F | Ht 70.0 in | Wt 181.0 lb

## 2022-01-13 DIAGNOSIS — Z131 Encounter for screening for diabetes mellitus: Secondary | ICD-10-CM | POA: Diagnosis not present

## 2022-01-13 DIAGNOSIS — E559 Vitamin D deficiency, unspecified: Secondary | ICD-10-CM

## 2022-01-13 DIAGNOSIS — Z13220 Encounter for screening for lipoid disorders: Secondary | ICD-10-CM

## 2022-01-13 DIAGNOSIS — M5441 Lumbago with sciatica, right side: Secondary | ICD-10-CM

## 2022-01-13 DIAGNOSIS — Z0000 Encounter for general adult medical examination without abnormal findings: Secondary | ICD-10-CM

## 2022-01-13 DIAGNOSIS — Z1329 Encounter for screening for other suspected endocrine disorder: Secondary | ICD-10-CM

## 2022-01-13 DIAGNOSIS — G8929 Other chronic pain: Secondary | ICD-10-CM | POA: Insufficient documentation

## 2022-01-13 DIAGNOSIS — F1290 Cannabis use, unspecified, uncomplicated: Secondary | ICD-10-CM

## 2022-01-13 MED ORDER — IBUPROFEN 800 MG PO TABS: 800.0000 mg | ORAL_TABLET | Freq: Three times a day (TID) | ORAL | 0 refills | Status: DC | PRN

## 2022-01-13 NOTE — Assessment & Plan Note (Signed)
Need to avoid smoking marijuana including risk of addiction to illict drugs was discussed.

## 2022-01-13 NOTE — Progress Notes (Signed)
New Patient Office Visit  Subjective:  Patient ID: Rebecca Fox, female    DOB: 1995/10/25  Age: 27 y.o. MRN: [id number]  CC:  Chief Complaint  Patient presents with   Back Pain    Pt stated--lower back pain radiated down back to the  right calf/leg worse when bending down- 2 years.    HPI Rebecca Fox is a 27 y.o. female with past medical history of low back pain, vitamin D deficiency who presents with complaints of chronic low back pain and to establish care with new provider.  Previous PCP Clarene Reamer FNP.    Chronic mid line low back pain with right sided sciatica.  Patient complains intermittent  chronic shooting , stabbing  low back pain, mostly localized to her right upper buttock radiating through her posterior right leg and calf,patient stated that her pain has been going on for the past 2 years. sometimes has tingling sensation on her right foot. She thinks that she had hip dislocation some years ago while playing soccer.  She also does lashes.  Patient denies fever, unintentional weight loss, bladder and bowel incontinence.  She has been taking Tylenol as needed also doing stretching exercises.  She stated that stretching exercises helps some. She currently rates her pain as 4/10.  Pain is worse with sloughing over    Due for flu vaccine and Tdap vaccine need for both vaccines discussed with the patient.  She declined flu vaccine in the office today.  Due for Pap exam patient stated that she will schedule this with her OB/GYN.         Past Medical History:  Diagnosis Date   Migraine     History reviewed. No pertinent surgical history.  Family History  Problem Relation Age of Onset   Breast cancer Paternal Grandmother    Colon cancer Neg Hx    Cervical cancer Neg Hx     Social History   Socioeconomic History   Marital status: Single    Spouse name: Not on file   Number of children: Not on file   Years of education: Not on file   Highest education  level: Not on file  Occupational History   Not on file  Tobacco Use   Smoking status: Never   Smokeless tobacco: Never  Vaping Use   Vaping Use: Never used  Substance and Sexual Activity   Alcohol use: Yes    Comment: occasionally   Drug use: Yes    Types: Marijuana   Sexual activity: Yes  Other Topics Concern   Not on file  Social History Narrative   Lives with her mother   Social Determinants of Health   Financial Resource Strain: Not on file  Food Insecurity: Not on file  Transportation Needs: Not on file  Physical Activity: Not on file  Stress: Not on file  Social Connections: Not on file  Intimate Partner Violence: Not on file    ROS Review of Systems  Constitutional:  Positive for chills. Negative for activity change, appetite change, diaphoresis, fatigue and unexpected weight change.  HENT: Negative.  Negative for congestion, dental problem, drooling and ear discharge.   Eyes:  Negative for pain, redness and itching.  Respiratory: Negative.  Negative for cough, choking, shortness of breath and wheezing.   Cardiovascular: Negative.  Negative for chest pain, palpitations and leg swelling.  Endocrine: Negative.   Genitourinary: Negative.   Musculoskeletal:  Positive for back pain. Negative for joint swelling, neck pain and neck stiffness.  Skin: Negative.   Neurological: Negative.  Negative for dizziness, facial asymmetry, light-headedness and numbness.  Hematological: Negative.   Psychiatric/Behavioral:  Negative for agitation, behavioral problems, confusion, decreased concentration and dysphoric mood.     Objective:   Today's Vitals: BP 114/64   Pulse 72   Temp 97.7 F (36.5 C)   Ht 5' 10" (1.778 m)   Wt 181 lb (82.1 kg)   SpO2 98%   BMI 25.97 kg/m   Physical Exam Vitals and nursing note reviewed. Exam conducted with a chaperone present.  Constitutional:      General: She is not in acute distress.    Appearance: Normal appearance. She is not  ill-appearing or toxic-appearing.  HENT:     Right Ear: Tympanic membrane, ear canal and external ear normal. There is no impacted cerumen.     Left Ear: Tympanic membrane, ear canal and external ear normal. There is no impacted cerumen.     Nose: Nose normal. No congestion or rhinorrhea.     Mouth/Throat:     Mouth: Mucous membranes are moist.     Pharynx: Oropharynx is clear. No oropharyngeal exudate or posterior oropharyngeal erythema.  Eyes:     General: No scleral icterus.       Right eye: No discharge.        Left eye: No discharge.     Extraocular Movements: Extraocular movements intact.     Conjunctiva/sclera: Conjunctivae normal.  Neck:     Vascular: No carotid bruit.  Cardiovascular:     Rate and Rhythm: Normal rate and regular rhythm.     Pulses: Normal pulses.     Heart sounds: Normal heart sounds. No murmur heard.    No friction rub. No gallop.  Pulmonary:     Effort: Pulmonary effort is normal. No respiratory distress.     Breath sounds: Normal breath sounds. No stridor. No wheezing, rhonchi or rales.  Chest:     Chest wall: No mass, lacerations, deformity, swelling, tenderness, crepitus or edema.  Breasts:    Tanner Score is 5.     Right: No swelling, bleeding, inverted nipple, mass, nipple discharge, skin change or tenderness.     Left: No swelling, bleeding, inverted nipple, mass, nipple discharge, skin change or tenderness.  Abdominal:     General: There is no distension.     Palpations: There is no mass.     Tenderness: There is no abdominal tenderness. There is no right CVA tenderness, left CVA tenderness, guarding or rebound.     Hernia: No hernia is present.  Musculoskeletal:        General: Tenderness present. No swelling, deformity or signs of injury.     Cervical back: Normal range of motion and neck supple. No rigidity or tenderness.     Right lower leg: No edema.     Left lower leg: No edema.     Comments: Tenderness of right side  low back area with  range of motion of the spine.  Skin warm and dry no redness or swelling noted.   Lymphadenopathy:     Cervical: No cervical adenopathy.     Upper Body:     Right upper body: No supraclavicular, axillary or pectoral adenopathy.     Left upper body: No supraclavicular, axillary or pectoral adenopathy.  Skin:    General: Skin is warm and dry.     Capillary Refill: Capillary refill takes less than 2 seconds.     Coloration: Skin is not jaundiced  or pale.     Findings: No bruising, erythema or lesion.  Neurological:     Mental Status: She is alert and oriented to person, place, and time.     Cranial Nerves: No cranial nerve deficit.     Sensory: No sensory deficit.     Motor: No weakness.     Coordination: Coordination normal.     Gait: Gait normal.     Deep Tendon Reflexes: Reflexes normal.  Psychiatric:        Mood and Affect: Mood normal.        Behavior: Behavior normal.        Thought Content: Thought content normal.        Judgment: Judgment normal.     Assessment & Plan:   Problem List Items Addressed This Visit       Nervous and Auditory   Chronic midline low back pain with right-sided sciatica     DG HIP UNILAT W OR W/O PELVIS 2-3 VIEWS RIGHT - ibuprofen (ADVIL) 800 MG tablet; Take 1 tablet (800 mg total) by mouth every 8 (eight) hours as needed.  Dispense: 30 tablet; Refill: 0 Please take this medication with meals. You can alternate the medication with tylenol 633m every 6 hours as needed.  - Ambulatory referral to Physical Therapy Stretching exercises and application of heat encouraged. Will refer to orthopedics if conservative management does not help.   She declined toradol and steroid injection today       Relevant Medications   ibuprofen (ADVIL) 800 MG tablet   Other Relevant Orders   DG HIP UNILAT W OR W/O PELVIS 2-3 VIEWS RIGHT   Ambulatory referral to Physical Therapy     Other   Annual physical exam - Primary    Annual exam as documented.   Counseling done include healthy lifestyle involving committing to 150 minutes of exercise per week, heart healthy diet, and attaining healthy weight. The importance of adequate sleep also discussed.  Regular use of seat belt and home safety were also discussed . Immunization and cancer screening  needs are specifically addressed at this visit.  Due for flu vaccine and Tdap vaccines need for both vaccines discussed with the patient today.  Due for Pap exam she will follow-up with her OB/GYN for her Pap exam       Relevant Orders   CBC with Differential   CMP14+EGFR   Marijuana user    Need to avoid smoking marijuana including risk of addiction to illict drugs was discussed.       Other Visit Diagnoses     Screening for diabetes mellitus       Relevant Orders   Hemoglobin A1c   Screening for thyroid disorder       Relevant Orders   TSH   Vitamin D deficiency       Relevant Orders   Vitamin D, 25-hydroxy   Screening for lipid disorders       Relevant Orders   Lipid Panel       Outpatient Encounter Medications as of 01/13/2022  Medication Sig   ibuprofen (ADVIL) 800 MG tablet Take 1 tablet (800 mg total) by mouth every 8 (eight) hours as needed.   No facility-administered encounter medications on file as of 01/13/2022.    Follow-up: Return in about 3 months (around 04/14/2022) for sciatica .   FRenee Rival FNP

## 2022-01-13 NOTE — Patient Instructions (Addendum)
Please consider getting your TDAP vaccine and Flu vaccine  Sciatica of left side  - DG HIP UNILAT W OR W/O PELVIS 2-3 VIEWS RIGHT - ibuprofen (ADVIL) 800 MG tablet; Take 1 tablet (800 mg total) by mouth every 8 (eight) hours as needed.  Dispense: 30 tablet; Refill: 0 Please take this medication with meals. You can alternate the medication with tylenol 650mg  every 6 hours as needed.  - Ambulatory referral to Physical Therapy  It is important that you exercise regularly at least 30 minutes 5 times a week as tolerated  Think about what you will eat, plan ahead. Choose " clean, green, fresh or frozen" over canned, processed or packaged foods which are more sugary, salty and fatty. 70 to 75% of food eaten should be vegetables and fruit. Three meals at set times with snacks allowed between meals, but they must be fruit or vegetables. Aim to eat over a 12 hour period , example 7 am to 7 pm, and STOP after  your last meal of the day. Drink water,generally about 64 ounces per day, no other drink is as healthy. Fruit juice is best enjoyed in a healthy way, by EATING the fruit.  Thanks for choosing [facility] we consider it a privelige to serve you.

## 2022-01-13 NOTE — Assessment & Plan Note (Signed)
Annual exam as documented.  Counseling done include healthy lifestyle involving committing to 150 minutes of exercise per week, heart healthy diet, and attaining healthy weight. The importance of adequate sleep also discussed.  Regular use of seat belt and home safety were also discussed . Immunization and cancer screening  needs are specifically addressed at this visit.  Due for flu vaccine and Tdap vaccines need for both vaccines discussed with the patient today.  Due for Pap exam she will follow-up with her OB/GYN for her Pap exam

## 2022-01-13 NOTE — Assessment & Plan Note (Signed)
DG HIP UNILAT W OR W/O PELVIS 2-3 VIEWS RIGHT - ibuprofen (ADVIL) 800 MG tablet; Take 1 tablet (800 mg total) by mouth every 8 (eight) hours as needed.  Dispense: 30 tablet; Refill: 0 Please take this medication with meals. You can alternate the medication with tylenol 650mg  every 6 hours as needed.  - Ambulatory referral to Physical Therapy Stretching exercises and application of heat encouraged. Will refer to orthopedics if conservative management does not help.   She declined toradol and steroid injection today

## 2022-01-14 LAB — LIPID PANEL
Chol/HDL Ratio: 2.3 ratio (ref 0.0–4.4)
Cholesterol, Total: 135 mg/dL (ref 100–199)
HDL: 58 mg/dL (ref >39)
LDL Chol Calc (NIH): 64 mg/dL (ref 0–99)
Triglycerides: 59 mg/dL (ref 0–149)
VLDL Cholesterol Cal: 13 mg/dL (ref 5–40)

## 2022-01-14 LAB — CBC WITH DIFFERENTIAL/PLATELET
Basophils Absolute: 0.1 10*3/uL (ref 0.0–0.2)
Basos: 1 %
EOS (ABSOLUTE): 0.3 10*3/uL (ref 0.0–0.4)
Eos: 3 %
Hematocrit: 39.6 % (ref 34.0–46.6)
Hemoglobin: 13.7 g/dL (ref 11.1–15.9)
Immature Grans (Abs): 0 10*3/uL (ref 0.0–0.1)
Immature Granulocytes: 0 %
Lymphocytes Absolute: 4.3 10*3/uL — ABNORMAL HIGH (ref 0.7–3.1)
Lymphs: 49 %
MCH: 32.2 pg (ref 26.6–33.0)
MCHC: 34.6 g/dL (ref 31.5–35.7)
MCV: 93 fL (ref 79–97)
Monocytes Absolute: 0.5 10*3/uL (ref 0.1–0.9)
Monocytes: 6 %
Neutrophils Absolute: 3.6 10*3/uL (ref 1.4–7.0)
Neutrophils: 41 %
Platelets: 289 10*3/uL (ref 150–450)
RBC: 4.26 x10E6/uL (ref 3.77–5.28)
RDW: 11 % — ABNORMAL LOW (ref 11.7–15.4)
WBC: 8.8 10*3/uL (ref 3.4–10.8)

## 2022-01-14 LAB — CMP14+EGFR
ALT: 12 IU/L (ref 0–32)
AST: 17 IU/L (ref 0–40)
Albumin/Globulin Ratio: 2.4 — ABNORMAL HIGH (ref 1.2–2.2)
Albumin: 4.6 g/dL (ref 4.0–5.0)
Alkaline Phosphatase: 76 IU/L (ref 44–121)
BUN/Creatinine Ratio: 19 (ref 9–23)
BUN: 14 mg/dL (ref 6–20)
Bilirubin Total: 0.3 mg/dL (ref 0.0–1.2)
CO2: 23 mmol/L (ref 20–29)
Calcium: 9.1 mg/dL (ref 8.7–10.2)
Chloride: 105 mmol/L (ref 96–106)
Creatinine, Ser: 0.75 mg/dL (ref 0.57–1.00)
Globulin, Total: 1.9 g/dL (ref 1.5–4.5)
Glucose: 83 mg/dL (ref 70–99)
Potassium: 4.3 mmol/L (ref 3.5–5.2)
Sodium: 141 mmol/L (ref 134–144)
Total Protein: 6.5 g/dL (ref 6.0–8.5)
eGFR: 112 mL/min/{1.73_m2} (ref >59)

## 2022-01-14 LAB — HEMOGLOBIN A1C
Est. average glucose Bld gHb Est-mCnc: 100 mg/dL
Hgb A1c MFr Bld: 5.1 % (ref 4.8–5.6)

## 2022-01-14 LAB — TSH: TSH: 1.32 u[IU]/mL (ref 0.450–4.500)

## 2022-01-14 LAB — VITAMIN D 25 HYDROXY (VIT D DEFICIENCY, FRACTURES): Vit D, 25-Hydroxy: 18.2 ng/mL — ABNORMAL LOW (ref 30.0–100.0)

## 2022-01-14 NOTE — Progress Notes (Signed)
Normal xray, no evidence of fracture, dislocation or arthritis. Continue current medication and follow up as planned

## 2022-01-14 NOTE — Progress Notes (Signed)
Lymphocytosis. Patient should report fever, weight loss, and night sweats, we will recheck labs at her upcoming appointment.   Vitamin D deficiency. Start taking vitamin D 1000 units daily Foods rich in vitamin D include Cod liver oil,Salmon,Swordfish,Tuna fish,Orange juice fortified with vitamin D,Dairy and plant milks fortified with vitamin D,Sardines,Beef liver . Get early morning sunshine.  Other labs are normal.

## 2022-01-21 NOTE — Therapy (Signed)
OUTPATIENT PHYSICAL THERAPY THORACOLUMBAR EVALUATION   Patient Name: Rebecca Fox MRN: [id number] DOB:1995/01/15, 27 y.o., female Today's Date: 01/23/2022  END OF SESSION:  PT End of Session - 01/23/22 1448     Visit Number 1    Number of Visits 16    Date for PT Re-Evaluation 03/20/22    Progress Note Due on Visit 16    PT Start Time 5035    PT Stop Time 1346    PT Time Calculation (min) 43 min    Activity Tolerance Patient tolerated treatment well;No increased pain;Patient limited by pain    Behavior During Therapy [facility] for tasks assessed/performed             Past Medical History:  Diagnosis Date   Migraine    History reviewed. No pertinent surgical history. Patient Active Problem List   Diagnosis Date Noted   Annual physical exam 01/13/2022   Chronic midline low back pain with right-sided sciatica 01/13/2022   Marijuana user 01/13/2022    PCP: Elby Beck, FNP  REFERRING PROVIDER: Renee Rival, FNP  REFERRING DIAG:  Diagnosis  M54.32 (ICD-10-CM) - Sciatica of left side    Rationale for Evaluation and Treatment: Rehabilitation  THERAPY DIAG:  Abnormal posture - Plan: PT plan of care cert/re-cert  Muscle weakness (generalized) - Plan: PT plan of care cert/re-cert  Other low back pain - Plan: PT plan of care cert/re-cert  Radiculopathy, lumbar region - Plan: PT plan of care cert/re-cert  ONSET DATE: 2 year duration  SUBJECTIVE:                                                                                                                                                                                           SUBJECTIVE STATEMENT: 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  She notes intermittent R sciatica as distal as the foot.  Symptoms are worst with standing for long periods of time at work.  She gets relief with a massage roller and Biofreeze.  PERTINENT HISTORY:   Migranes  PAIN:  Are you having pain? Yes: NPRS scale: 3-6/10 Pain location: Midline low back and right lower extremity as distal as the foot Pain description: Ache, can be sharp with some tingling and paresthesias Aggravating factors: Prolonged standing and flexion Relieving factors: Massage roller and Biofreeze  PRECAUTIONS: Back  WEIGHT BEARING RESTRICTIONS: No  FALLS:  Has patient fallen in last 6 months? 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  PLOF: Independent  PATIENT GOALS: Get back to normal function.  NEXT MD VISIT: NA  OBJECTIVE:   DIAGNOSTIC FINDINGS:  EXAM: DG HIP (WITH OR WITHOUT PELVIS) 3V RIGHT   COMPARISON:  None Available.   FINDINGS: There is no evidence of hip fracture or dislocation. There is no evidence of arthropathy or other focal bone abnormality.   IMPRESSION: Negative.  PATIENT SURVEYS:  FOTO 43 (Goal 53 in 13 visits)  SCREENING FOR RED FLAGS: Bowel or bladder incontinence: No Spinal tumors: No Cauda equina syndrome: No Compression fracture: No  COGNITION: Overall cognitive status: Within functional limits for tasks assessed     SENSATION: Intermittent right leg paresthesia to the toes  MUSCLE LENGTH: Hamstrings: Right 40 deg; Left 40 deg  POSTURE: rounded shoulders, forward head, and decreased lumbar lordosis  LUMBAR ROM:   AROM eval  Flexion   Extension 20  Right lateral flexion   Left lateral flexion   Right rotation   Left rotation    (Blank rows = not tested)  LOWER EXTREMITY ROM:     Passive  Right eval Left eval  Hip flexion 85 90  Hip extension    Hip abduction    Hip adduction    Hip internal rotation 18 17  Hip external rotation 18 25  Knee flexion    Knee extension    Ankle dorsiflexion    Ankle plantarflexion     Ankle inversion    Ankle eversion     (Blank rows = not tested)  LOWER EXTREMITY Strength:    Strength 01/23/2022   Hip flexion    Hip extension    Hip abduction    Hip adduction    Hip internal rotation    Hip external rotation    Spine strength Deferred   Knee extension    Ankle dorsiflexion    Ankle plantarflexion    Ankle inversion    Ankle eversion     (Blank rows = not tested)   GAIT: Distance walked: In the clinic Assistive device utilized: None Level of assistance: Complete Independence Comments: Rebecca Fox notes she does not walk for exercise  TODAY'S TREATMENT:                                                                                                                              DATE: 01/23/2022 Trunk extension AROM 10 x 3 seconds Single knee-to-chest stretch 2 x 20 seconds bilateral Modified Thomas stretch 2 x 20 seconds bilateral Figure 4 stretch supine 4 x 20 seconds bilateral Prone alternating hip extensions 2 sets of 10 for 3 seconds  Functional activities: Reviewed spine anatomy with the spine model, discussed the importance of avoiding flexed postures, reviewed correct logroll and bed mobility   PATIENT EDUCATION:  Education details: See above Person educated: Patient Education method: Explanation, Demonstration, Tactile cues, Verbal cues, and Handouts Education comprehension: verbalized understanding, returned demonstration, verbal  cues required, tactile cues required, and needs further education  HOME EXERCISE PROGRAM: Access Code: [email or web address] URL: [email or web address] Date: 01/23/2022 Prepared by: Rebecca Fox  Exercises - Standing Lumbar Extension at [place]  - 5 x daily - 7 x weekly - 1 sets - 5 reps - 3 seconds hold - Single Knee to Chest Stretch  - 2-3 x daily - 7 x weekly - 1 sets - 5 reps - 20 seconds hold - Modified Thomas Stretch  - 2-3 x daily - 7 x weekly - 5 reps - 20 seconds hold - Supine Figure 4 Piriformis  Stretch  - 2-3 x daily - 7 x weekly - 1 sets - 5 reps - 20 seconds hold - Supine Figure 4 Piriformis Stretch  - 2-3 x daily - 7 x weekly - 1 sets - 5 reps - 20 seconds hold - Prone Hip Extension  - 1 x daily - 7 x weekly - 2-3 sets - 10 reps - 3 seconds hold  ASSESSMENT:  CLINICAL IMPRESSION: Patient is a 27 y.o. female who was seen today for physical therapy evaluation and treatment for low back pain with right sciatica.  Rebecca Fox has a 2-year history of low back pain with right sciatica.  Symptoms are worst at work when she is in flexed postures.  She was started today on an appropriate home exercise program emphasizing postural awareness, correct body mechanics, trunk extension active range of motion to reduce possible lumbar disc and muscle spasm involvement, low back strength and appropriate lower extremity flexibility to help Rebecca Fox meet long-term goals.  OBJECTIVE IMPAIRMENTS: decreased activity tolerance, decreased endurance, decreased knowledge of condition, difficulty walking, decreased ROM, decreased strength, decreased safety awareness, impaired perceived functional ability, increased muscle spasms, impaired flexibility, improper body mechanics, postural dysfunction, and pain.   ACTIVITY LIMITATIONS: bending, standing, and caring for others  PARTICIPATION LIMITATIONS: community activity and occupation  PERSONAL FACTORS:  Migranes  are also affecting patient's functional outcome.   REHAB POTENTIAL: Good  CLINICAL DECISION MAKING: Stable/uncomplicated  EVALUATION COMPLEXITY: Low   GOALS: Goals reviewed with patient? Yes  SHORT TERM GOALS: Target date: 02/20/2022  Improve lumbar extension AROM to 15 degrees Baseline: 5 degrees Goal status: INITIAL  2.  Improve bilateral hip flexion to at least 90 degrees and hip ER AROM to at least 30 degrees Baseline: 85-90 & 18-25 degrees  3.  Rebecca Fox will be able to implement correct body mechanics and to activities of daily  living Baseline: Unable at evaluation Goal status: INITIAL  LONG TERM GOALS: Target date: 03/20/2022  Improve FOTO to 53 in 13 visits Baseline: 43 Goal status: INITIAL  2.  Rebecca Fox will report low back pain consistently less than 3 out of 10 on the visual analog scale with no complaints of right lower extremity pain Baseline: Low back and right lower extremity pain rated 3-6 out of 10 Goal status: INITIAL  3.  Improve lumbar extension AROM to 20 degrees Baseline: 5 degrees Goal status: INITIAL  4.  Improve bilateral lower extremity flexibility for hip flexors to 100, hamstrings to 50 and hip ER AROM to 40 degrees Baseline: 85-90, 40/40 and 18-25 degrees Goal status: INITIAL  5.  Rebecca Fox will be able to maintain the spine strength test position for 30 to 60 seconds Baseline: Unable to assume test postures at evaluation Goal status: INITIAL  6.  Rebecca Fox will be independent with her long-term home exercise program at discharge Baseline: Started 01/23/2022 Goal status: INITIAL  PLAN:  PT  FREQUENCY: 1-2x/week  PT DURATION: 8 weeks  PLANNED INTERVENTIONS: Therapeutic exercises, Therapeutic activity, Gait training, Patient/Family education, Self Care, Joint mobilization, Dry Needling, Spinal mobilization, Cryotherapy, Traction, and Manual therapy.  PLAN FOR NEXT SESSION: Review day 1 home exercise program.  Emphasis on practical body mechanics at work on avoiding flexion.  Continue low back strength progressions and follow-up on home walking prescription compliance.   Cherlyn Cushing, PT, MPT 01/23/2022, 3:11 PM

## 2022-01-23 ENCOUNTER — Encounter: Payer: Self-pay | Admitting: Rehabilitative and Restorative Service Providers"

## 2022-01-23 ENCOUNTER — Ambulatory Visit (INDEPENDENT_AMBULATORY_CARE_PROVIDER_SITE_OTHER): Payer: Self-pay | Admitting: Rehabilitative and Restorative Service Providers"

## 2022-01-23 DIAGNOSIS — M5416 Radiculopathy, lumbar region: Secondary | ICD-10-CM

## 2022-01-23 DIAGNOSIS — R293 Abnormal posture: Secondary | ICD-10-CM

## 2022-01-23 DIAGNOSIS — M6281 Muscle weakness (generalized): Secondary | ICD-10-CM

## 2022-01-23 DIAGNOSIS — M5459 Other low back pain: Secondary | ICD-10-CM

## 2022-02-03 ENCOUNTER — Encounter: Payer: Self-pay | Admitting: Rehabilitative and Restorative Service Providers"

## 2022-02-05 ENCOUNTER — Other Ambulatory Visit: Payer: Self-pay

## 2022-02-05 DIAGNOSIS — G8929 Other chronic pain: Secondary | ICD-10-CM

## 2022-02-05 MED ORDER — IBUPROFEN 800 MG PO TABS: 800.0000 mg | ORAL_TABLET | Freq: Three times a day (TID) | ORAL | 0 refills | Status: DC | PRN

## 2022-02-06 ENCOUNTER — Encounter: Payer: Self-pay | Admitting: Nurse Practitioner

## 2022-02-06 ENCOUNTER — Ambulatory Visit: Payer: Self-pay | Admitting: Nurse Practitioner

## 2022-02-06 ENCOUNTER — Ambulatory Visit (INDEPENDENT_AMBULATORY_CARE_PROVIDER_SITE_OTHER): Payer: Self-pay | Admitting: Nurse Practitioner

## 2022-02-06 VITALS — BP 124/64 | HR 70 | Temp 97.7°F | Wt 181.0 lb

## 2022-02-06 DIAGNOSIS — E559 Vitamin D deficiency, unspecified: Secondary | ICD-10-CM

## 2022-02-06 DIAGNOSIS — M5441 Lumbago with sciatica, right side: Secondary | ICD-10-CM

## 2022-02-06 DIAGNOSIS — G8929 Other chronic pain: Secondary | ICD-10-CM

## 2022-02-06 MED ORDER — CYCLOBENZAPRINE HCL 5 MG PO TABS: 5.0000 mg | ORAL_TABLET | Freq: Every day | ORAL | 0 refills | Status: DC

## 2022-02-06 MED ORDER — METHYLPREDNISOLONE 4 MG PO TBPK: ORAL_TABLET | ORAL | 0 refills | Status: DC

## 2022-02-06 MED ORDER — VITAMIN D (ERGOCALCIFEROL) 1.25 MG (50000 UNIT) PO CAPS: 50000.0000 [IU] | ORAL_CAPSULE | ORAL | 0 refills | Status: DC

## 2022-02-06 MED ORDER — KETOROLAC TROMETHAMINE 60 MG/2ML IM SOLN
60.0000 mg | Freq: Once | INTRAMUSCULAR | Status: AC
  Administered 2022-02-06: 60 mg via INTRAMUSCULAR

## 2022-02-06 MED ORDER — METHYLPREDNISOLONE SODIUM SUCC 40 MG IJ SOLR
40.0000 mg | Freq: Once | INTRAMUSCULAR | Status: AC
  Administered 2022-02-06: 40 mg via INTRAMUSCULAR

## 2022-02-06 NOTE — Progress Notes (Signed)
Acute Office Visit  Subjective:     Patient ID: Rebecca Fox, female    DOB: 20-Aug-1995, 27 y.o.   MRN: [id number]  Chief Complaint  Patient presents with   Leg Pain    HPI [name] presents for follow-up for her chronic low back pain with right-sided sciatica.  She continues to have mid low back pain, cramps in  her right calf worse with standing.  She has started physical therapy.  She reports that physical therapy has been helping some, plans on having therapy done for 6 to 8 weeks.  She has been taking Tylenol as needed as this helps better than ibuprofen.  She denies numbness, tingling, urinary incontinence, fever, chills, shortness of breath, chest pain.   Review of Systems  Respiratory: Negative.  Negative for cough, hemoptysis, sputum production, shortness of breath and wheezing.   Cardiovascular: Negative.  Negative for chest pain, palpitations, orthopnea, claudication and leg swelling.  Genitourinary: Negative.   Musculoskeletal:  Positive for back pain and myalgias.  Neurological:  Negative for dizziness, sensory change, seizures, loss of consciousness and headaches.  Psychiatric/Behavioral:  Negative for depression, memory loss, substance abuse and suicidal ideas. The patient does not have insomnia.         Objective:    BP 124/64   Pulse 70   Temp 97.7 F (36.5 C)   Wt 181 lb (82.1 kg)   SpO2 99%   BMI 25.97 kg/m    Physical Exam Constitutional:      General: She is not in acute distress.    Appearance: Normal appearance. She is not ill-appearing, toxic-appearing or diaphoretic.  Eyes:     General: No scleral icterus.       Right eye: No discharge.        Left eye: No discharge.     Extraocular Movements: Extraocular movements intact.  Cardiovascular:     Rate and Rhythm: Normal rate and regular rhythm.     Pulses: Normal pulses.     Heart sounds: Normal heart sounds. No murmur heard.    No friction rub. No gallop.  Pulmonary:     Effort:  Pulmonary effort is normal. No respiratory distress.     Breath sounds: Normal breath sounds. No stridor. No wheezing, rhonchi or rales.  Chest:     Chest wall: No tenderness.  Abdominal:     Palpations: Abdomen is soft.  Musculoskeletal:     Right lower leg: No edema.     Left lower leg: No edema.     Comments: Mild tenderness on palpation of mid low back, skin warm and dry no swelling or redness noted has palpable pedal pulses.   Skin:    General: Skin is warm and dry.  Neurological:     Mental Status: She is alert and oriented to person, place, and time.     Sensory: No sensory deficit.     Motor: No weakness.     Gait: Gait normal.  Psychiatric:        Mood and Affect: Mood normal.        Behavior: Behavior normal.        Thought Content: Thought content normal.        Judgment: Judgment normal.     No results found for any visits on 02/06/22.      Assessment & Plan:   Problem List Items Addressed This Visit       Nervous and Auditory   Chronic midline low back  pain with right-sided sciatica - Primary     - cyclobenzaprine (FLEXERIL) 5 MG tablet; Take 1 tablet (5 mg total) by mouth at bedtime for muscle spasm - methylPREDNISolone (MEDROL DOSEPAK) 4 MG TBPK tablet; Take as instructed  Toradol 60 mg, Solu-Medrol 40 mg one-time injection given in the office today. Continue physical therapy as planned Take Tylenol 650 mg every 6 hours as needed. Patient encouraged to take Flexeril at bedtime due to side effects of drowsiness       Relevant Medications   cyclobenzaprine (FLEXERIL) 5 MG tablet   methylPREDNISolone (MEDROL DOSEPAK) 4 MG TBPK tablet     Other   Vitamin D deficiency    Reports that she takes vitamin D supplements at home not sure of the strength.  I discussed with the patient that we could take vitamin D 50,000 units once weekly for 6 weeks, Rx sent to the pharmacy.  Getting her vitamin D levels corrected could help her muscle cramps Last vitamin  D Lab Results  Component Value Date   VD25OH 18.2 (L) 01/13/2022         Relevant Medications   Vitamin D, Ergocalciferol, (DRISDOL) 1.25 MG (50000 UNIT) CAPS capsule    Meds ordered this encounter  Medications   cyclobenzaprine (FLEXERIL) 5 MG tablet    Sig: Take 1 tablet (5 mg total) by mouth at bedtime.    Dispense:  30 tablet    Refill:  0   Vitamin D, Ergocalciferol, (DRISDOL) 1.25 MG (50000 UNIT) CAPS capsule    Sig: Take 1 capsule (50,000 Units total) by mouth every 7 (seven) days.    Dispense:  6 capsule    Refill:  0   methylPREDNISolone (MEDROL DOSEPAK) 4 MG TBPK tablet    Sig: Take as instructed    Dispense:  1 each    Refill:  0   methylPREDNISolone sodium succinate (SOLU-MEDROL) 40 mg/mL injection 40 mg   ketorolac (TORADOL) injection 60 mg    No follow-ups on file.  Renee Rival, FNP

## 2022-02-06 NOTE — Assessment & Plan Note (Addendum)
-   cyclobenzaprine (FLEXERIL) 5 MG tablet; Take 1 tablet (5 mg total) by mouth at bedtime for muscle spasm - methylPREDNISolone (MEDROL DOSEPAK) 4 MG TBPK tablet; Take as instructed  Toradol 60 mg, Solu-Medrol 40 mg one-time injection given in the office today. Continue physical therapy as planned Take Tylenol 650 mg every 6 hours as needed. Patient encouraged to take Flexeril at bedtime due to side effects of drowsiness

## 2022-02-06 NOTE — Patient Instructions (Addendum)
1. Chronic midline low back pain with right-sided sciatica - cyclobenzaprine (FLEXERIL) 5 MG tablet; Take 1 tablet (5 mg total) by mouth at bedtime.  Take tylenol 650mg  every 6 hours as needed, Please continue PT as discussed   methylPREDNISolone (MEDROL DOSEPAK) 4 MG TBPK tablet; Take as instructed  Dispense: 1 each; Refill: 0  You were given toradol 60 mg injection and solumedrol 40mg  injection today    2. Vitamin D deficiency  - Vitamin D, Ergocalciferol, (DRISDOL) 1.25 MG (50000 UNIT) CAPS capsule; Take 1 capsule (50,000 Units total) by mouth every 7 (seven) days.  Dispense: 6 capsule; Refill: 0    Thanks for choosing Patient Rebecca Fox we consider it a privelige to serve you.

## 2022-02-06 NOTE — Assessment & Plan Note (Signed)
Reports that she takes vitamin D supplements at home not sure of the strength.  I discussed with the patient that we could take vitamin D 50,000 units once weekly for 6 weeks, Rx sent to the pharmacy.  Getting her vitamin D levels corrected could help her muscle cramps Last vitamin D Lab Results  Component Value Date   VD25OH 18.2 (L) 01/13/2022

## 2022-02-13 ENCOUNTER — Ambulatory Visit (INDEPENDENT_AMBULATORY_CARE_PROVIDER_SITE_OTHER): Payer: BLUE CROSS/BLUE SHIELD | Admitting: Rehabilitative and Restorative Service Providers"

## 2022-02-13 ENCOUNTER — Encounter: Payer: Self-pay | Admitting: Rehabilitative and Restorative Service Providers"

## 2022-02-13 DIAGNOSIS — M6281 Muscle weakness (generalized): Secondary | ICD-10-CM | POA: Diagnosis not present

## 2022-02-13 DIAGNOSIS — M5416 Radiculopathy, lumbar region: Secondary | ICD-10-CM | POA: Diagnosis not present

## 2022-02-13 DIAGNOSIS — M5459 Other low back pain: Secondary | ICD-10-CM | POA: Diagnosis not present

## 2022-02-13 DIAGNOSIS — R293 Abnormal posture: Secondary | ICD-10-CM | POA: Diagnosis not present

## 2022-02-13 NOTE — Therapy (Signed)
OUTPATIENT PHYSICAL THERAPY TREATMENT NOTE   Patient Name: Rebecca Fox MRN: [id number] DOB:1995-11-15, 27 y.o., female Today's Date: 02/13/2022  END OF SESSION:   PT End of Session - 02/13/22 1523     Visit Number 2    Number of Visits 16    Date for PT Re-Evaluation 03/20/22    Progress Note Due on Visit 16    PT Start Time 1431    PT Stop Time 1513    PT Time Calculation (min) 42 min    Activity Tolerance Patient tolerated treatment well;No increased pain;Patient limited by pain    Behavior During Therapy [facility] for tasks assessed/performed             Past Medical History:  Diagnosis Date   Migraine    History reviewed. No pertinent surgical history. Patient Active Problem List   Diagnosis Date Noted   Vitamin D deficiency 02/06/2022   Annual physical exam 01/13/2022   Chronic midline low back pain with right-sided sciatica 01/13/2022   Marijuana user 01/13/2022     THERAPY DIAG:  Abnormal posture  Muscle weakness (generalized)  Other low back pain  Radiculopathy, lumbar region PCP: Elby Beck, FNP   REFERRING PROVIDER: Renee Rival, FNP   REFERRING DIAG:  Diagnosis  M54.32 (ICD-10-CM) - Sciatica of left side      Rationale for Evaluation and Treatment: Rehabilitation   THERAPY DIAG:  Abnormal posture - Plan: PT plan of care cert/re-cert   Muscle weakness (generalized) - Plan: PT plan of care cert/re-cert   Other low back pain - Plan: PT plan of care cert/re-cert   Radiculopathy, lumbar region - Plan: PT plan of care cert/re-cert   ONSET DATE: 2 year duration   SUBJECTIVE:                                                                                                                                                                                            SUBJECTIVE STATEMENT: Rebecca Fox reports being more posturally aware and 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  She notes intermittent R sciatica as  distal as the distal thigh (was to the foot). Symptoms are worst with standing for long periods of time at work.  She gets relief with a massage roller and Biofreeze.   PERTINENT HISTORY:  Migranes   PAIN:  Are you having pain? Yes: NPRS scale: 3-6/10 Pain location: Midline low back and right lower extremity as distal as the foot Pain description: Ache, can be sharp with some tingling and paresthesias Aggravating factors: Prolonged standing and flexion Relieving factors: Massage roller and Biofreeze  PRECAUTIONS: Back   WEIGHT BEARING RESTRICTIONS: No   FALLS:  Has patient fallen in last 6 months? 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   PLOF: Independent   PATIENT GOALS: Get back to normal function.   NEXT MD VISIT: NA   OBJECTIVE:    DIAGNOSTIC FINDINGS:  EXAM: DG HIP (WITH OR WITHOUT PELVIS) 3V RIGHT   COMPARISON:  None Available.   FINDINGS: There is no evidence of hip fracture or dislocation. There is no evidence of arthropathy or other focal bone abnormality.   IMPRESSION: Negative.   PATIENT SURVEYS:  FOTO 56 (Goal 53 in 13 visits)   SCREENING FOR RED FLAGS: Bowel or bladder incontinence: No Spinal tumors: No Cauda equina syndrome: No Compression fracture: No   COGNITION: Overall cognitive status: Within functional limits for tasks assessed                          SENSATION: Intermittent right leg paresthesia to the toes   MUSCLE LENGTH: 02/13/2022 Hamstrings: Right 50 deg; Left 55 deg  01/23/2022 Hamstrings: Right 40 deg; Left 40 deg   POSTURE: rounded shoulders, forward head, and decreased lumbar lordosis   LUMBAR ROM:    AROM 01/23/2022 02/13/2022  Flexion     Extension 5 10  Right lateral flexion     Left lateral flexion     Right rotation     Left rotation       (Blank rows = not tested)   LOWER EXTREMITY ROM:      Passive  Right eval Left eval Left/Right 02/13/2022  Hip flexion 85 90 100/95  Hip extension       Hip abduction       Hip adduction       Hip internal rotation 18 17   Hip external rotation 18 25 39/25  Knee flexion       Knee extension       Ankle dorsiflexion       Ankle plantarflexion       Ankle inversion       Ankle eversion        (Blank rows = not tested)   LOWER EXTREMITY Strength:     Strength 01/23/2022  02/13/2022  Hip flexion      Hip extension      Hip abduction      Hip adduction      Hip internal rotation      Hip external rotation      Spine strength Deferred 14 seconds   Knee extension      Ankle dorsiflexion      Ankle plantarflexion      Ankle inversion      Ankle eversion       (Blank rows = not tested)     GAIT: Distance walked: In the clinic Assistive device utilized: None Level of assistance: Complete Independence Comments: Rebecca Fox notes she does not walk for exercise   TODAY'S TREATMENT:  DATE:  02/13/2022 Trunk extension AROM 10 x 3 seconds Modified Thomas stretch 5 x 20 seconds bilateral Modified figure 4 stretch with knee to chest 5 x 20 seconds bilateral Hamstrings stretch 5X 20 seconds bilateral (other leg straight) Prone alternating hip extensions 2 sets of 10 for 3 seconds Prone alternating arm and leg extension 10 x 3 seconds   Functional activities: Reviewed spine anatomy with the spine model, reviewed the importance of avoiding flexed postures, reviewed correct logroll and bed mobility   01/23/2022 Trunk extension AROM 10 x 3 seconds Single knee-to-chest stretch 2 x 20 seconds bilateral Modified Thomas stretch 2 x 20 seconds bilateral Figure 4 stretch supine 4 x 20 seconds bilateral Prone alternating hip extensions 2 sets of 10 for 3 seconds    Functional activities: Reviewed spine anatomy with the spine model, discussed the importance of avoiding flexed postures, reviewed correct logroll and bed mobility     PATIENT EDUCATION:  Education details: See above Person educated: Patient Education method: Explanation, Demonstration, Tactile cues, Verbal cues, and Handouts Education comprehension: verbalized understanding, returned demonstration, verbal cues required, tactile cues required, and needs further education   HOME EXERCISE PROGRAM: Access Code: [email or web address] URL: [email or web address] Date: 01/23/2022 Prepared by: Vista Mink   Exercises - Standing Lumbar Extension at [place]  - 5 x daily - 7 x weekly - 1 sets - 5 reps - 3 seconds hold - Single Knee to Chest Stretch  - 2-3 x daily - 7 x weekly - 1 sets - 5 reps - 20 seconds hold - Modified Thomas Stretch  - 2-3 x daily - 7 x weekly - 5 reps - 20 seconds hold - Supine Figure 4 Piriformis Stretch  - 2-3 x daily - 7 x weekly - 1 sets - 5 reps - 20 seconds hold - Supine Figure 4 Piriformis Stretch  - 2-3 x daily - 7 x weekly - 1 sets - 5 reps - 20 seconds hold - Prone Hip Extension  - 1 x daily - 7 x weekly - 2-3 sets - 10 reps - 3 seconds hold   ASSESSMENT:   CLINICAL IMPRESSION: Janera notes her peripheral symptoms have not been as distal since starting physical therapy.  Because she has a 2-year history of low back pain with right sciatica, hers will likely be a longer rehabilitation.  Symptoms are worst at work when she is in flexed postures.  Brief reassessment today showed progress with her flexibility and she was able to tolerate positioning for strength testing today whereas she was not at evaluation.  Continued supervised PT will emphasize postural awareness, correct body mechanics, trunk extension active range of motion to reduce possible lumbar disc and muscle spasm involvement, low back strength and appropriate lower extremity flexibility to help  [place] meet long-term goals.   OBJECTIVE IMPAIRMENTS: decreased activity tolerance, decreased endurance, decreased knowledge of condition, difficulty walking, decreased ROM, decreased strength, decreased safety awareness, impaired perceived functional ability, increased muscle spasms, impaired flexibility, improper body mechanics, postural dysfunction, and pain.    ACTIVITY LIMITATIONS: bending, standing, and caring for others   PARTICIPATION LIMITATIONS: community activity and occupation   PERSONAL FACTORS:  Migranes  are also affecting patient's functional outcome.    REHAB POTENTIAL: Good   CLINICAL DECISION MAKING: Stable/uncomplicated   EVALUATION COMPLEXITY: Low     GOALS: Goals reviewed with patient? Yes   SHORT TERM GOALS: Target date: 02/20/2022   Improve lumbar extension AROM to 15 degrees Baseline: 5 degrees Goal status:  Met 02/13/2022   2.  Improve bilateral hip flexion to at least 90 degrees and hip ER AROM to at least 30 degrees Baseline: 85-90 & 18-25 degrees Goal status: Ongoing 02/13/2022  3.  Jailey will be able to implement correct body mechanics and to activities of daily living Baseline: Unable at evaluation Goal status: Ongoing 02/13/2022   LONG TERM GOALS: Target date: 03/20/2022   Improve FOTO to 53 in 13 visits Baseline: 43 Goal status: INITIAL   2.  Lumi will report low back pain consistently less than 3 out of 10 on the visual analog scale with no complaints of right lower extremity pain Baseline: Low back and right lower extremity pain rated 3-6 out of 10 Goal status: Ongoing 02/13/2022   3.  Improve lumbar extension AROM to 20 degrees Baseline: 5 degrees Goal status: Ongoing 02/13/2022   4.  Improve bilateral lower extremity flexibility for hip flexors to 100, hamstrings to 50 and hip ER AROM to 40 degrees Baseline: 85-90, 40/40 and 18-25 degrees Goal status: Ongoing 02/13/2022   5.  Genavive will be able to maintain the spine strength test  position for 30 to 60 seconds Baseline: Unable to assume test postures at evaluation Goal status: Ongoing 02/13/2022   6.  Danah will be independent with her long-term home exercise program at discharge Baseline: Started 01/23/2022 Goal status: Ongoing 02/13/2022   PLAN:   PT FREQUENCY: 1-2x/week   PT DURATION: 8 weeks   PLANNED INTERVENTIONS: Therapeutic exercises, Therapeutic activity, Gait training, Patient/Family education, Self Care, Joint mobilization, Dry Needling, Spinal mobilization, Cryotherapy, Traction, and Manual therapy.   PLAN FOR NEXT SESSION: Review home exercise program.  Emphasis on practical body mechanics at work on avoiding flexion and appropriate low back strength progressions.  Follow-up on home walking prescription compliance.   Cherlyn Cushing, PT, MPT 02/13/2022, 3:32 PM

## 2022-02-20 ENCOUNTER — Encounter: Payer: Self-pay | Admitting: Rehabilitative and Restorative Service Providers"

## 2022-02-27 ENCOUNTER — Ambulatory Visit (INDEPENDENT_AMBULATORY_CARE_PROVIDER_SITE_OTHER): Payer: BLUE CROSS/BLUE SHIELD | Admitting: Rehabilitative and Restorative Service Providers"

## 2022-02-27 ENCOUNTER — Encounter: Payer: Self-pay | Admitting: Rehabilitative and Restorative Service Providers"

## 2022-02-27 DIAGNOSIS — R293 Abnormal posture: Secondary | ICD-10-CM | POA: Diagnosis not present

## 2022-02-27 DIAGNOSIS — M5459 Other low back pain: Secondary | ICD-10-CM | POA: Diagnosis not present

## 2022-02-27 DIAGNOSIS — M6281 Muscle weakness (generalized): Secondary | ICD-10-CM

## 2022-02-27 DIAGNOSIS — M5416 Radiculopathy, lumbar region: Secondary | ICD-10-CM

## 2022-02-27 NOTE — Therapy (Signed)
OUTPATIENT PHYSICAL THERAPY TREATMENT NOTE   Patient Name: Rebecca Fox MRN: [id number] DOB:1995-03-16, 27 y.o., female Today's Date: 02/27/2022  END OF SESSION:   PT End of Session - 02/27/22 1531     Visit Number 3    Number of Visits 16    Date for PT Re-Evaluation 03/20/22    Progress Note Due on Visit 16    PT Start Time 1430    PT Stop Time 1520    PT Time Calculation (min) 50 min    Activity Tolerance Patient tolerated treatment well;No increased pain;Patient limited by fatigue    Behavior During Therapy [facility] for tasks assessed/performed             Past Medical History:  Diagnosis Date   Migraine    History reviewed. No pertinent surgical history. Patient Active Problem List   Diagnosis Date Noted   Vitamin D deficiency 02/06/2022   Annual physical exam 01/13/2022   Chronic midline low back pain with right-sided sciatica 01/13/2022   Marijuana user 01/13/2022     THERAPY DIAG:  Abnormal posture  Other low back pain  Muscle weakness (generalized)  Radiculopathy, lumbar region PCP: Elby Beck, FNP   REFERRING PROVIDER: Renee Rival, FNP   REFERRING DIAG:  Diagnosis  M54.32 (ICD-10-CM) - Sciatica of left side      Rationale for Evaluation and Treatment: Rehabilitation   THERAPY DIAG:  Abnormal posture - Plan: PT plan of care cert/re-cert   Muscle weakness (generalized) - Plan: PT plan of care cert/re-cert   Other low back pain - Plan: PT plan of care cert/re-cert   Radiculopathy, lumbar region - Plan: PT plan of care cert/re-cert   ONSET DATE: 2 year duration   SUBJECTIVE:                                                                                                                                                                                            SUBJECTIVE STATEMENT: Rebecca Fox notes peripheral symptoms are now as distal as the mid-posterior thigh (were as distal the toes at evaluation, as distal as the distal femur 2  weeks ago).  Overall pain has decreased as well. 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  She gets relief with the Biofreeze.   PERTINENT HISTORY:  Migranes   PAIN:  Are you having pain? Yes: NPRS scale: 0-5/10 Pain location: Midline low back and right lower extremity as distal as the mid-thigh (was foot) Pain description: Ache, can be sharp with some tingling and paresthesias Aggravating factors: Prolonged standing and flexion Relieving factors: Massage roller and Biofreeze  PRECAUTIONS: Back   WEIGHT BEARING RESTRICTIONS: No   FALLS:  Has patient fallen in last 6 months? 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   PLOF: Independent   PATIENT GOALS: Get back to normal function.   NEXT MD VISIT: NA   OBJECTIVE:    DIAGNOSTIC FINDINGS:  EXAM:  DG HIP (WITH OR WITHOUT PELVIS) 3V RIGHT   COMPARISON: None Available.   FINDINGS: There is no evidence of hip fracture or dislocation. There is no evidence of arthropathy or other focal bone abnormality.   IMPRESSION: Negative.   PATIENT SURVEYS:  FOTO 81 (Goal 53 in 13 visits)   SCREENING FOR RED FLAGS: Bowel or bladder incontinence: No Spinal tumors: No Cauda equina syndrome: No Compression fracture: No   COGNITION: Overall cognitive status: Within functional limits for tasks assessed                          SENSATION: Intermittent right leg paresthesia to the toes   MUSCLE LENGTH: 02/27/2021 Hamstrings: Right 50 deg; Left 55 deg  02/13/2022 Hamstrings: Right 50 deg; Left 55 deg  01/23/2022 Hamstrings: Right 40 deg; Left 40 deg   POSTURE: rounded shoulders, forward head, and decreased lumbar lordosis   LUMBAR ROM:    AROM 01/23/2022 02/13/2022 02/27/2022  Flexion      Extension 5 10 15  $ Right lateral  flexion      Left lateral flexion      Right rotation      Left rotation       (Blank rows = not tested)   LOWER EXTREMITY ROM:      Passive  Right eval Left eval Left/Right 02/13/2022 Left/Right 02/27/2022  Hip flexion 85 90 100/95 105/110  Hip extension        Hip abduction        Hip adduction        Hip internal rotation 18 17  10/20  Hip external rotation 18 25 39/25 38/36  Knee flexion        Knee extension        Ankle dorsiflexion        Ankle plantarflexion        Ankle inversion        Ankle eversion         (Blank rows = not tested)   LOWER EXTREMITY Strength:     Strength 01/23/2022  02/13/2022 02/27/2022  Hip flexion       Hip extension       Hip abduction       Hip adduction       Hip internal rotation       Hip external rotation       Spine strength Deferred 14 seconds  45 seconds  Knee extension       Ankle dorsiflexion       Ankle plantarflexion       Ankle inversion       Ankle eversion        (Blank rows = not tested)     GAIT: Distance walked: In the clinic Assistive device utilized: None Level of assistance: Complete Independence Comments: Rebecca Fox notes she does not walk for exercise   TODAY'S TREATMENT:  DATE:  02/27/2022 Trunk extension AROM 10 x 3 seconds Modified Thomas stretch 4 x 20 seconds bilateral Modified figure 4 stretch with knee to chest 4 x 20 seconds bilateral Hamstrings stretch 4X 20 seconds bilateral (other leg straight) Prone alternating hip extensions 10 for 3 seconds Prone alternating arm and leg extension 10 x 5 seconds Prone superwoman 10X 5 seconds Hip hike in door frame 2 sets of 5 for 3 seconds   Functional activities: Reviewed the importance of avoiding flexed postures, reviewed correct logroll and bed mobility, golfer's and diagonal squat lifts, discussed driving posture and lumbar roll  use.   02/13/2022 Trunk extension AROM 10 x 3 seconds Modified Thomas stretch 5 x 20 seconds bilateral Modified figure 4 stretch with knee to chest 5 x 20 seconds bilateral Hamstrings stretch 5X 20 seconds bilateral (other leg straight) Prone alternating hip extensions 2 sets of 10 for 3 seconds Prone alternating arm and leg extension 10 x 3 seconds   Functional activities: Reviewed spine anatomy with the spine model, reviewed the importance of avoiding flexed postures, reviewed correct logroll and bed mobility   01/23/2022 Trunk extension AROM 10 x 3 seconds Single knee-to-chest stretch 2 x 20 seconds bilateral Modified Thomas stretch 2 x 20 seconds bilateral Figure 4 stretch supine 4 x 20 seconds bilateral Prone alternating hip extensions 2 sets of 10 for 3 seconds   Functional activities: Reviewed spine anatomy with the spine model, discussed the importance of avoiding flexed postures, reviewed correct logroll and bed mobility     PATIENT EDUCATION:  Education details: See above Person educated: Patient Education method: Explanation, Demonstration, Tactile cues, Verbal cues, and Handouts Education comprehension: verbalized understanding, returned demonstration, verbal cues required, tactile cues required, and needs further education   HOME EXERCISE PROGRAM: Access Code: [email or web address] URL: [email or web address] Date: 02/27/2022 Prepared by: Vista Mink  Exercises - Standing Lumbar Extension at [place]  - 5 x daily - 7 x weekly - 1 sets - 5 reps - 3 seconds hold - Single Knee to Chest Stretch  - 1-2 x daily - 1 x weekly - 1 sets - 5 reps - 20 seconds hold - Modified Thomas Stretch  - 1-2 x daily - 7 x weekly - 5 reps - 20 seconds hold - Supine Figure 4 Piriformis Stretch  - 1-2 x daily - 7 x weekly - 1 sets - 5 reps - 20 seconds hold - Prone Hip Extension  - 1 x daily - 1 x weekly - 2 sets - 10 reps - 3 seconds hold - Supine Hamstring Stretch  - 1-2 x daily  - 7 x weekly - 1 sets - 5 reps - 20 seconds hold - Prone Alternating Arm and Leg Lifts  - 1 x daily - 7 x weekly - 2 sets - 10 reps - 5-10 seconds hold - Full Superman on Table  - 1 x daily - 7 x weekly - 1 sets - 10 reps - 5-10 seconds hold - Standing Hip Hiking  - 5 x daily - 7 x weekly - 1 sets - 5 reps - 3 seconds hold   ASSESSMENT:   CLINICAL IMPRESSION: Rebecca Fox notes her peripheral symptoms continue to be less distal since starting physical therapy.  Because she has a 2-year history of low back pain with right sciatica, hers will likely be a longer rehabilitation.  Symptoms are worst at work when she is in flexed postures.  Brief reassessment today showed continued progress with her flexibility, AROM  and spine strength.  Continued supervised PT will reduce possible lumbar disc and muscle spasm involvement, address low back strength and appropriate lower extremity flexibility to help Rebecca Fox meet long-term goals.   OBJECTIVE IMPAIRMENTS: decreased activity tolerance, decreased endurance, decreased knowledge of condition, difficulty walking, decreased ROM, decreased strength, decreased safety awareness, impaired perceived functional ability, increased muscle spasms, impaired flexibility, improper body mechanics, postural dysfunction, and pain.    ACTIVITY LIMITATIONS: bending, standing, and caring for others   PARTICIPATION LIMITATIONS: community activity and occupation   PERSONAL FACTORS:  Migranes  are also affecting patient's functional outcome.    REHAB POTENTIAL: Good   CLINICAL DECISION MAKING: Stable/uncomplicated   EVALUATION COMPLEXITY: Low     GOALS: Goals reviewed with patient? Yes   SHORT TERM GOALS: Target date: 02/20/2022   Improve lumbar extension AROM to 15 degrees Baseline: 5 degrees Goal status: Met 02/13/2022   2.  Improve bilateral hip flexion to at least 90 degrees and hip ER AROM to at least 30 degrees Baseline: 85-90 & 18-25 degrees Goal status: Met  02/27/2022  3.  Rebecca Fox will be able to implement correct body mechanics and to activities of daily living Baseline: Unable at evaluation Goal status: Ongoing 02/27/2022   LONG TERM GOALS: Target date: 03/20/2022   Improve FOTO to 53 in 13 visits Baseline: 43 Goal status: INITIAL   2.  Rebecca Fox will report low back pain consistently less than 3 out of 10 on the visual analog scale with no complaints of right lower extremity pain Baseline: Low back and right lower extremity pain rated 3-6 out of 10 Goal status: Ongoing 02/27/2022   3.  Improve lumbar extension AROM to 20 degrees Baseline: 5 degrees Goal status: Ongoing 02/27/2022   4.  Improve bilateral lower extremity flexibility for hip flexors to 100, hamstrings to 50 and hip ER AROM to 40 degrees Baseline: 85-90, 40/40 and 18-25 degrees Goal status: Partially Met 02/27/2022   5.  Rebecca Fox will be able to maintain the spine strength test position for 30 to 60 seconds Baseline: Unable to assume test postures at evaluation Goal status: Met (45) 02/27/2022   6.  Rebecca Fox will be independent with her long-term home exercise program at discharge Baseline: Started 01/23/2022 Goal status: Ongoing 02/27/2022   PLAN:   PT FREQUENCY: 1-2x/week   PT DURATION: 8 weeks   PLANNED INTERVENTIONS: Therapeutic exercises, Therapeutic activity, Gait training, Patient/Family education, Self Care, Joint mobilization, Dry Needling, Spinal mobilization, Cryotherapy, Traction, and Manual therapy.   PLAN FOR NEXT SESSION: Review changes/progressions to her home exercise program.  Emphasis on practical body mechanics at work on avoiding flexion and appropriate low back strength progressions.  FOTO.   Farley Ly, PT, MPT 02/27/2022, 3:41 PM

## 2022-03-05 ENCOUNTER — Other Ambulatory Visit: Payer: Self-pay | Admitting: Nurse Practitioner

## 2022-03-05 DIAGNOSIS — G8929 Other chronic pain: Secondary | ICD-10-CM

## 2022-03-06 ENCOUNTER — Telehealth: Payer: Self-pay

## 2022-03-06 ENCOUNTER — Encounter: Payer: BLUE CROSS/BLUE SHIELD | Admitting: Rehabilitative and Restorative Service Providers"

## 2022-03-09 ENCOUNTER — Telehealth: Payer: Self-pay

## 2022-03-09 ENCOUNTER — Other Ambulatory Visit: Payer: Self-pay | Admitting: Nurse Practitioner

## 2022-03-09 DIAGNOSIS — G8929 Other chronic pain: Secondary | ICD-10-CM

## 2022-03-09 MED ORDER — CYCLOBENZAPRINE HCL 5 MG PO TABS: 5.0000 mg | ORAL_TABLET | Freq: Every day | ORAL | 0 refills | Status: DC

## 2022-03-13 ENCOUNTER — Encounter: Payer: BLUE CROSS/BLUE SHIELD | Admitting: Rehabilitative and Restorative Service Providers"

## 2022-03-13 DIAGNOSIS — Z419 Encounter for procedure for purposes other than remedying health state, unspecified: Secondary | ICD-10-CM | POA: Diagnosis not present

## 2022-03-16 NOTE — Telephone Encounter (Signed)
Done. [place]

## 2022-03-16 NOTE — Telephone Encounter (Signed)
Done. [place]

## 2022-03-18 ENCOUNTER — Other Ambulatory Visit: Payer: Self-pay | Admitting: Nurse Practitioner

## 2022-03-18 ENCOUNTER — Telehealth: Payer: Self-pay

## 2022-03-18 ENCOUNTER — Telehealth: Payer: Self-pay | Admitting: Nurse Practitioner

## 2022-03-18 DIAGNOSIS — G8929 Other chronic pain: Secondary | ICD-10-CM

## 2022-03-18 MED ORDER — CYCLOBENZAPRINE HCL 5 MG PO TABS: 5.0000 mg | ORAL_TABLET | Freq: Every evening | ORAL | 0 refills | Status: DC | PRN

## 2022-03-18 NOTE — Telephone Encounter (Signed)
Medication refilled per provider. [place]

## 2022-03-18 NOTE — Telephone Encounter (Signed)
Caller & Relationship to patient:  MRN #  [id number]   Call Back Number:   Date of Last Office Visit: 03/09/2022     Date of Next Office Visit: 04/21/2022    Medication(s) to be Refilled: Muscle relaxer  Preferred Pharmacy:   ** Please notify patient to allow 48-72 hours to process** **Let patient know to contact pharmacy at the end of the day to make sure medication is ready. ** **If patient has not been seen in a year or longer, book an appointment **Advise to use MyChart for refill requests OR to contact their pharmacy

## 2022-03-19 NOTE — Telephone Encounter (Signed)
Done

## 2022-03-20 ENCOUNTER — Encounter: Payer: Self-pay | Admitting: Rehabilitative and Restorative Service Providers"

## 2022-03-20 ENCOUNTER — Ambulatory Visit (INDEPENDENT_AMBULATORY_CARE_PROVIDER_SITE_OTHER): Payer: BLUE CROSS/BLUE SHIELD | Admitting: Rehabilitative and Restorative Service Providers"

## 2022-03-20 DIAGNOSIS — M5459 Other low back pain: Secondary | ICD-10-CM | POA: Diagnosis not present

## 2022-03-20 DIAGNOSIS — M5416 Radiculopathy, lumbar region: Secondary | ICD-10-CM | POA: Diagnosis not present

## 2022-03-20 DIAGNOSIS — R293 Abnormal posture: Secondary | ICD-10-CM | POA: Diagnosis not present

## 2022-03-20 DIAGNOSIS — M6281 Muscle weakness (generalized): Secondary | ICD-10-CM

## 2022-03-20 NOTE — Therapy (Addendum)
OUTPATIENT PHYSICAL THERAPY TREATMENT NOTE & RECERTIFICATION   /DISCHARGE   Patient Name: Rebecca Fox MRN: [id number] DOB:[birth year], 27 y.o., female Today's Date: 03/20/2022  END OF SESSION:   PT End of Session - 03/20/22 1519     Visit Number 4    Number of Visits 16    Date for PT Re-Evaluation 03/20/22    Progress Note Due on Visit 16    PT Start Time 1517    PT Stop Time 1601    PT Time Calculation (min) 44 min    Activity Tolerance Patient tolerated treatment well;No increased pain    Behavior During Therapy WFL for tasks assessed/performed              Past Medical History:  Diagnosis Date   Migraine    History reviewed. No pertinent surgical history. Patient Active Problem List   Diagnosis Date Noted   Vitamin D deficiency 02/06/2022   Annual physical exam 01/13/2022   Chronic midline low back pain with right-sided sciatica 01/13/2022   Marijuana user 01/13/2022     THERAPY DIAG:  Abnormal posture - Plan: PT plan of care cert/re-cert  Other low back pain - Plan: PT plan of care cert/re-cert  Muscle weakness (generalized) - Plan: PT plan of care cert/re-cert  Radiculopathy, lumbar region - Plan: PT plan of care cert/re-cert PCP: Emi Belfast, FNP   REFERRING PROVIDER: Donell Beers, FNP   REFERRING DIAG:  Diagnosis  M54.32 (ICD-10-CM) - Sciatica of left side      Rationale for Evaluation and Treatment: Rehabilitation   THERAPY DIAG:  Abnormal posture - Plan: PT plan of care cert/re-cert   Muscle weakness (generalized) - Plan: PT plan of care cert/re-cert   Other low back pain - Plan: PT plan of care cert/re-cert   Radiculopathy, lumbar region - Plan: PT plan of care cert/re-cert   ONSET DATE: 2 year duration   SUBJECTIVE:                                                                                                                                                                                            SUBJECTIVE  STATEMENT: Rebecca Fox notes peripheral symptoms in the right  posterior thigh.  Symptoms are less intense but slightly more distal.  Symptoms are as distal as the distal-posterior thigh (were as distal the toes at evaluation).  Overall pain has decreased as well.  She notes a bit more discomfort the past few days after some awkward and heavier lifting at work.   PERTINENT HISTORY:  Migranes   PAIN:  Are you having pain? Yes: NPRS scale: 0-3/10 low  back and right posterior thigh 0-4/10 this week Pain location: Midline low back and right lower extremity as distal as the mid-thigh (was foot) Pain description: Ache, can be sharp with some tingling and paresthesias Aggravating factors: Prolonged standing and flexion Relieving factors: Massage roller and Biofreeze   PRECAUTIONS: Back   WEIGHT BEARING RESTRICTIONS: No   FALLS:  Has patient fallen in last 6 months? 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   PLOF: Independent   PATIENT GOALS: Get back to normal function.   NEXT MD VISIT: NA   OBJECTIVE:    DIAGNOSTIC FINDINGS:  EXAM:  DG HIP (WITH OR WITHOUT PELVIS) 3V RIGHT   COMPARISON: None Available.   FINDINGS: There is no evidence of hip fracture or dislocation. There is no evidence of arthropathy or other focal bone abnormality.   IMPRESSION: Negative.   PATIENT SURVEYS:  FOTO   Eval FOTO 43 (Goal 53 in 13 visits)   SCREENING FOR RED FLAGS: Bowel or bladder incontinence: No Spinal tumors: No Cauda equina syndrome: No Compression fracture: No   COGNITION: Overall cognitive status: Within functional limits for tasks assessed                          SENSATION: Intermittent right leg paresthesia to the toes   MUSCLE LENGTH: 03/20/2022 Hamstrings: Right 45 deg; Left 60 deg  02/27/2021  Hamstrings: Right 50 deg; Left 55 deg  02/13/2022 Hamstrings: Right 50 deg; Left 55 deg  01/23/2022 Hamstrings: Right 40 deg; Left 40 deg   POSTURE: rounded shoulders, forward head, and decreased lumbar lordosis   LUMBAR ROM:    AROM 01/23/2022 02/13/2022 02/27/2022 03/20/2022  Flexion       Extension 5 10 15 15   Right lateral flexion       Left lateral flexion       Right rotation       Left rotation        (Blank rows = not tested)   LOWER EXTREMITY ROM:      Passive  Right eval Left eval Left/Right 02/13/2022 Left/Right 02/27/2022 Left/Right 03/20/2022  Hip flexion 85 90 100/95 105/110 110/110  Hip extension         Hip abduction         Hip adduction         Hip internal rotation 18 17  10/20 10/15  Hip external rotation 18 25 39/25 38/36 41/32   Knee flexion         Knee extension         Ankle dorsiflexion         Ankle plantarflexion         Ankle inversion         Ankle eversion          (Blank rows = not tested)   LOWER EXTREMITY Strength:     Strength 01/23/2022  02/13/2022 02/27/2022  Hip flexion       Hip extension       Hip abduction       Hip adduction       Hip internal rotation       Hip external rotation       Spine strength Deferred 14 seconds  45 seconds  Knee extension  Ankle dorsiflexion       Ankle plantarflexion       Ankle inversion       Ankle eversion        (Blank rows = not tested)     GAIT: Distance walked: In the clinic Assistive device utilized: None Level of assistance: Complete Independence Comments: Rebecca Fox notes she does not walk for exercise   TODAY'S TREATMENT: 03/20/2022 Trunk extension AROM 10 x 3 seconds Modified Thomas stretch 2 x 20 seconds bilateral, DC  Single knee to chest stretch 2 x 20 seconds bilateral, DC Modified figure 4 stretch with knee to chest 4 x 20 seconds bilateral Hamstrings stretch 4X 20 seconds bilateral (other leg straight) Prone alternating hip extensions 10 for 5 seconds Prone alternating arm and leg  extension 10 x 5 seconds Prone superwoman 5X 10 seconds Hip hike iwith pelvic stabilization 2 sets of 5 for 3 seconds   Functional activities: Reviewed the importance of avoiding flexed postures, reviewed correct logroll and bed mobility, golfer's and diagonal squat lifts, discussed lifting techniques for work and home.                                                                                               DATE:  02/27/2022 Trunk extension AROM 10 x 3 seconds Modified Thomas stretch 4 x 20 seconds bilateral Modified figure 4 stretch with knee to chest 4 x 20 seconds bilateral Hamstrings stretch 4X 20 seconds bilateral (other leg straight) Prone alternating hip extensions 10 for 3 seconds Prone alternating arm and leg extension 10 x 5 seconds Prone superwoman 10X 5 seconds Hip hike in door frame 2 sets of 5 for 3 seconds   Functional activities: Reviewed the importance of avoiding flexed postures, reviewed correct logroll and bed mobility, golfer's and diagonal squat lifts, discussed driving posture and lumbar roll use.   02/13/2022 Trunk extension AROM 10 x 3 seconds Modified Thomas stretch 5 x 20 seconds bilateral Modified figure 4 stretch with knee to chest 5 x 20 seconds bilateral Hamstrings stretch 5X 20 seconds bilateral (other leg straight) Prone alternating hip extensions 2 sets of 10 for 3 seconds Prone alternating arm and leg extension 10 x 3 seconds   Functional activities: Reviewed spine anatomy with the spine model, reviewed the importance of avoiding flexed postures, reviewed correct logroll and bed mobility   01/23/2022 Trunk extension AROM 10 x 3 seconds Single knee-to-chest stretch 2 x 20 seconds bilateral Modified Thomas stretch 2 x 20 seconds bilateral Figure 4 stretch supine 4 x 20 seconds bilateral Prone alternating hip extensions 2 sets of 10 for 3 seconds   Functional activities: Reviewed spine anatomy with the spine model, discussed the importance of  avoiding flexed postures, reviewed correct logroll and bed mobility     PATIENT EDUCATION:  Education details: See above Person educated: Patient Education method: Explanation, Demonstration, Tactile cues, Verbal cues, and Handouts Education comprehension: verbalized understanding, returned demonstration, verbal cues required, tactile cues required, and needs further education   HOME EXERCISE PROGRAM: Access Code: [email or web address] URL: [email or web address] Date: 02/27/2022 Prepared by: Pauletta Browns  Exercises - Standing  Lumbar Extension at Wall - Forearms  - 5 x daily - 7 x weekly - 1 sets - 5 reps - 3 seconds hold - Single Knee to Chest Stretch  - 1-2 x daily - 1 x weekly - 1 sets - 5 reps - 20 seconds hold - Modified Thomas Stretch  - 1-2 x daily - 7 x weekly - 5 reps - 20 seconds hold - Supine Figure 4 Piriformis Stretch  - 1-2 x daily - 7 x weekly - 1 sets - 5 reps - 20 seconds hold - Prone Hip Extension  - 1 x daily - 1 x weekly - 2 sets - 10 reps - 3 seconds hold - Supine Hamstring Stretch  - 1-2 x daily - 7 x weekly - 1 sets - 5 reps - 20 seconds hold - Prone Alternating Arm and Leg Lifts  - 1 x daily - 7 x weekly - 2 sets - 10 reps - 5-10 seconds hold - Full Superman on Table  - 1 x daily - 7 x weekly - 1 sets - 10 reps - 5-10 seconds hold - Standing Hip Hiking  - 5 x daily - 7 x weekly - 1 sets - 5 reps - 3 seconds hold   ASSESSMENT:   CLINICAL IMPRESSION: Jadzia notes overall progress since starting physical therapy.  Low back and right leg pain have improved and her peripheral symptoms are not as distal as they were at evaluation.  She does appear to have a slight increase in symptoms over the past few days after some awkward and heavier lifting at work.  We spent some time reviewing lifting techniques and made slight modifications to her home exercise program based on today's assessment.  Rebecca Fox is doing a very good overall job with her mostly independent  rehabilitation and we decided to follow-up in a month after her vacation and some time for her to work on her slightly modified program today.  Her peripheral symptoms continue to be less distal since starting physical therapy.  We discussed that her physical therapy may be a longer process since her greater than 2-year duration of symptoms.  She will likely continue to benefit from a twice a month supervised visit schedule for another month or 2.   OBJECTIVE IMPAIRMENTS: decreased activity tolerance, decreased endurance, decreased knowledge of condition, difficulty walking, decreased ROM, decreased strength, decreased safety awareness, impaired perceived functional ability, increased muscle spasms, impaired flexibility, improper body mechanics, postural dysfunction, and pain.    ACTIVITY LIMITATIONS: bending, standing, and caring for others   PARTICIPATION LIMITATIONS: community activity and occupation   PERSONAL FACTORS:  Migranes  are also affecting patient's functional outcome.    REHAB POTENTIAL: Good   CLINICAL DECISION MAKING: Stable/uncomplicated   EVALUATION COMPLEXITY: Low     GOALS: Goals reviewed with patient? Yes   SHORT TERM GOALS: Target date: 02/20/2022   Improve lumbar extension AROM to 15 degrees Baseline: 5 degrees Goal status: Met 02/13/2022   2.  Improve bilateral hip flexion to at least 90 degrees and hip ER AROM to at least 30 degrees Baseline: 85-90 & 18-25 degrees Goal status: Met 02/27/2022  3.  Rebecca Fox will be able to implement correct body mechanics and to activities of daily living Baseline: Unable at evaluation Goal status: Ongoing 03/20/2022   LONG TERM GOALS: Target date: 03/20/2022   Improve FOTO to 53 in 13 visits Baseline: 43 Goal status: INITIAL   2.  Rebecca Fox will report low back pain consistently less  than 3 out of 10 on the visual analog scale with no complaints of right lower extremity pain Baseline: Low back and right lower extremity pain rated  3-6 out of 10 Goal status: Ongoing 03/20/2022   3.  Improve lumbar extension AROM to 20 degrees Baseline: 5 degrees Goal status: Ongoing 03/20/2022   4.  Improve bilateral lower extremity flexibility for hip flexors to 100, hamstrings to 50 and hip ER AROM to 40 degrees Baseline: 85-90, 40/40 and 18-25 degrees Goal status: Partially Met 03/20/2022   5.  Rebecca Fox will be able to maintain the spine strength test position for 30 to 60 seconds Baseline: Unable to assume test postures at evaluation Goal status: Met (45) 02/27/2022   6.  Rebecca Fox will be independent with her long-term home exercise program at discharge Baseline: Started 01/23/2022 Goal status: Ongoing 03/20/2022   PLAN:   PT FREQUENCY: 1-2x/week   PT DURATION: 8 weeks   PLANNED INTERVENTIONS: Therapeutic exercises, Therapeutic activity, Gait training, Patient/Family education, Self Care, Joint mobilization, Dry Needling, Spinal mobilization, Cryotherapy, Traction, and Manual therapy.   PLAN FOR NEXT SESSION: Review changes/progressions to her home exercise program. Emphasis on practical body mechanics at work on avoiding flexion and appropriate low back strength progressions.  FOTO.   Cherlyn Cushing, PT, MPT 03/20/2022, 4:33 PM   PHYSICAL THERAPY DISCHARGE SUMMARY  Visits from Start of Care: 4  Current functional level related to goals / functional outcomes: See note   Remaining deficits: See note   Education / Equipment: HEP  Patient goals were partially met. Patient is being discharged due to not returning since the last visit.  Rebecca Fox, PT, DPT, OCS, ATC 04/29/22  3:28 PM

## 2022-04-13 DIAGNOSIS — Z419 Encounter for procedure for purposes other than remedying health state, unspecified: Secondary | ICD-10-CM | POA: Diagnosis not present

## 2022-04-14 ENCOUNTER — Ambulatory Visit: Payer: Self-pay | Admitting: Nurse Practitioner

## 2022-04-17 ENCOUNTER — Encounter: Payer: BLUE CROSS/BLUE SHIELD | Admitting: Rehabilitative and Restorative Service Providers"

## 2022-04-21 ENCOUNTER — Encounter: Payer: Self-pay | Admitting: Nurse Practitioner

## 2022-04-21 ENCOUNTER — Telehealth: Payer: Self-pay | Admitting: *Deleted

## 2022-04-21 ENCOUNTER — Ambulatory Visit: Payer: BLUE CROSS/BLUE SHIELD | Admitting: Nurse Practitioner

## 2022-04-21 ENCOUNTER — Ambulatory Visit (INDEPENDENT_AMBULATORY_CARE_PROVIDER_SITE_OTHER): Payer: BLUE CROSS/BLUE SHIELD | Admitting: Nurse Practitioner

## 2022-04-21 VITALS — BP 103/59 | HR 72 | Ht 70.0 in | Wt 192.6 lb

## 2022-04-21 DIAGNOSIS — G8929 Other chronic pain: Secondary | ICD-10-CM | POA: Diagnosis not present

## 2022-04-21 DIAGNOSIS — D72820 Lymphocytosis (symptomatic): Secondary | ICD-10-CM | POA: Diagnosis not present

## 2022-04-21 DIAGNOSIS — M5441 Lumbago with sciatica, right side: Secondary | ICD-10-CM | POA: Diagnosis not present

## 2022-04-21 DIAGNOSIS — E559 Vitamin D deficiency, unspecified: Secondary | ICD-10-CM

## 2022-04-21 MED ORDER — GABAPENTIN 100 MG PO CAPS: 100.0000 mg | ORAL_CAPSULE | Freq: Three times a day (TID) | ORAL | 2 refills | Status: DC

## 2022-04-21 MED ORDER — CYCLOBENZAPRINE HCL 5 MG PO TABS
5.0000 mg | ORAL_TABLET | Freq: Every evening | ORAL | 0 refills | Status: DC | PRN
Start: 2022-04-21 — End: 2022-06-19

## 2022-04-21 MED ORDER — VITAMIN D (ERGOCALCIFEROL) 1.25 MG (50000 UNIT) PO CAPS: 50000.0000 [IU] | ORAL_CAPSULE | ORAL | 0 refills | Status: DC

## 2022-04-21 NOTE — Assessment & Plan Note (Signed)
Lab Results  Component Value Date   WBC 8.8 01/13/2022   HGB 13.7 01/13/2022   HCT 39.6 01/13/2022   MCV 93 01/13/2022   PLT 289 01/13/2022  Absolute lymphocytes is elevated at 4.3 but WBC is normal. Patient denies fever, chills, unintentional weight loss , fatigue.  Will recheck labs at next visit.

## 2022-04-21 NOTE — Patient Instructions (Addendum)
1. Chronic midline low back pain with right-sided sciatica  - gabapentin (NEURONTIN) 100 MG capsule; Take 1 capsule (100 mg total) by mouth 3 (three) times daily.  Dispense: 90 capsule; Refill: 2 - Ambulatory referral to Orthopedic Surgery     Gabapentin and flexeril  can make you drowsy please let us know if you experience any adverse reactions to the medication, follow up with orthopedics as discussed    It is important that you exercise regularly at least 30 minutes 5 times a week as tolerated  Think about what you will eat, plan ahead. Choose " clean, green, fresh or frozen" over canned, processed or packaged foods which are more sugary, salty and fatty. 70 to 75% of food eaten should be vegetables and fruit. Three meals at set times with snacks allowed between meals, but they must be fruit or vegetables. Aim to eat over a 12 hour period , example 7 am to 7 pm, and STOP after  your last meal of the day. Drink water,generally about 64 ounces per day, no other drink is as healthy. Fruit juice is best enjoyed in a healthy way, by EATING the fruit.  Thanks for choosing [facility] we consider it a privelige to serve you.

## 2022-04-21 NOTE — Assessment & Plan Note (Signed)
She still did not pick up the prescription for vitamin D Patient encouraged to pick up the prescription for vitamin D 50,000 units and take as ordered Recheck labs at next visit

## 2022-04-21 NOTE — Assessment & Plan Note (Addendum)
She continues to have pain despite attending physical therapy.  Will do a trial of gabapentin 100 mg 3 times daily, continue Flexeril 5 mg sparingly at bedtime.  Side effects of medication including drowsiness discussed. Continue stretching exercises  - gabapentin (NEURONTIN) 100 MG capsule; Take 1 capsule (100 mg total) by mouth 3 (three) times daily.  Dispense: 90 capsule; Refill: 2 - Ambulatory referral to Orthopedic Surgery - cyclobenzaprine (FLEXERIL) 5 MG tablet; Take 1 tablet (5 mg total) by mouth at bedtime as needed for muscle spasms.  Dispense: 30 tablet; Refill: 0

## 2022-04-21 NOTE — Progress Notes (Signed)
Established Patient Office Visit  Subjective:  Patient ID: Rebecca Fox, female    DOB: 02-Nov-1995  Age: 27 y.o. MRN: [id number]  CC:  Chief Complaint  Patient presents with   Follow-up    Pt is fasting.    HPI Rebecca Fox is a 27 y.o. female  has a past medical history of Chronic midline low back pain with right-sided sciatica, Migraine, and Vitamin D deficiency.   Patient presents for follow-up for her chronic mid line low back pain with right-sided sciatica.  She has completed 8 weeks of physical therapy.  Muscle relaxant helps her pain at night, stretching exercises also helps to some extent ,but her pain persist during the day.  She reported not having much benefit from physical therapy.  She continues to have intermittent sharp pain on her posterior right lower leg, numbness on her right thigh.  Patient denies fever, chills, unintentional weight loss, no complaints of urinary or bowel incontinence.  Pain is worse with prolonged standing and ambulation    Past Medical History:  Diagnosis Date   Chronic midline low back pain with right-sided sciatica    Migraine    Vitamin D deficiency     History reviewed. No pertinent surgical history.  Family History  Problem Relation Age of Onset   Breast cancer Paternal Grandmother    Colon cancer Neg Hx    Cervical cancer Neg Hx     Social History   Socioeconomic History   Marital status: Single    Spouse name: Not on file   Number of children: Not on file   Years of education: Not on file   Highest education level: Not on file  Occupational History   Not on file  Tobacco Use   Smoking status: Never   Smokeless tobacco: Never  Vaping Use   Vaping Use: Never used  Substance and Sexual Activity   Alcohol use: Yes    Comment: occasionally   Drug use: Yes    Types: Marijuana   Sexual activity: Yes  Other Topics Concern   Not on file  Social History Narrative   Lives with her mother   Social Determinants of  Health   Financial Resource Strain: Not on file  Food Insecurity: Not on file  Transportation Needs: Not on file  Physical Activity: Not on file  Stress: Not on file  Social Connections: Not on file  Intimate Partner Violence: Not on file    Outpatient Medications Prior to Visit  Medication Sig Dispense Refill   cyclobenzaprine (FLEXERIL) 5 MG tablet Take 1 tablet (5 mg total) by mouth at bedtime as needed for muscle spasms. 30 tablet 0   Vitamin D, Ergocalciferol, (DRISDOL) 1.25 MG (50000 UNIT) CAPS capsule Take 1 capsule (50,000 Units total) by mouth every 7 (seven) days. 6 capsule 0   ibuprofen (ADVIL) 800 MG tablet TAKE 1 TABLET BY MOUTH EVERY 8 HOURS AS NEEDED (Patient not taking: Reported on 04/21/2022) 30 tablet 0   methylPREDNISolone (MEDROL DOSEPAK) 4 MG TBPK tablet Take as instructed (Patient not taking: Reported on 04/21/2022) 1 each 0   No facility-administered medications prior to visit.    No Known Allergies  ROS Review of Systems  Constitutional: Negative.  Negative for appetite change, chills, diaphoresis, fatigue and fever.  Respiratory: Negative.    Cardiovascular: Negative.   Genitourinary: Negative.   Musculoskeletal:  Positive for arthralgias and back pain. Negative for gait problem, joint swelling, myalgias and neck pain.  Neurological:  Positive for  numbness. Negative for dizziness, seizures, facial asymmetry, speech difficulty, light-headedness and headaches.  Psychiatric/Behavioral: Negative.        Objective:    Physical Exam Constitutional:      General: She is not in acute distress.    Appearance: Normal appearance. She is not ill-appearing, toxic-appearing or diaphoretic.  Eyes:     General: No scleral icterus.       Right eye: No discharge.        Left eye: No discharge.  Cardiovascular:     Rate and Rhythm: Normal rate and regular rhythm.     Pulses: Normal pulses.     Heart sounds: Normal heart sounds. No murmur heard.    No friction rub.  No gallop.  Pulmonary:     Effort: Pulmonary effort is normal. No respiratory distress.     Breath sounds: Normal breath sounds. No stridor. No wheezing, rhonchi or rales.  Chest:     Chest wall: No tenderness.  Abdominal:     General: There is no distension.     Tenderness: There is no abdominal tenderness. There is no guarding.  Musculoskeletal:        General: No tenderness or signs of injury.     Right lower leg: No edema.     Left lower leg: No edema.  Skin:    General: Skin is warm and dry.     Capillary Refill: Capillary refill takes less than 2 seconds.     Coloration: Skin is not jaundiced.     Findings: No bruising or lesion.  Neurological:     Mental Status: She is alert and oriented to person, place, and time.     Sensory: No sensory deficit.     Motor: No weakness.     Coordination: Coordination normal.  Psychiatric:        Mood and Affect: Mood normal.        Thought Content: Thought content normal.     BP (!) 103/59   Pulse 72   Ht 5\' 10"  (1.778 m)   Wt 192 lb 9.6 oz (87.4 kg)   SpO2 98%   BMI 27.64 kg/m  Wt Readings from Last 3 Encounters:  04/21/22 192 lb 9.6 oz (87.4 kg)  02/06/22 181 lb (82.1 kg)  01/13/22 181 lb (82.1 kg)    Lab Results  Component Value Date   TSH 1.320 01/13/2022   Lab Results  Component Value Date   WBC 8.8 01/13/2022   HGB 13.7 01/13/2022   HCT 39.6 01/13/2022   MCV 93 01/13/2022   PLT 289 01/13/2022   Lab Results  Component Value Date   NA 141 01/13/2022   K 4.3 01/13/2022   CO2 23 01/13/2022   GLUCOSE 83 01/13/2022   BUN 14 01/13/2022   CREATININE 0.75 01/13/2022   BILITOT 0.3 01/13/2022   ALKPHOS 76 01/13/2022   AST 17 01/13/2022   ALT 12 01/13/2022   PROT 6.5 01/13/2022   ALBUMIN 4.6 01/13/2022   CALCIUM 9.1 01/13/2022   ANIONGAP 8 01/19/2017   EGFR 112 01/13/2022   GFR 79.47 06/02/2019   Lab Results  Component Value Date   CHOL 135 01/13/2022   Lab Results  Component Value Date   HDL 58  01/13/2022   Lab Results  Component Value Date   LDLCALC 64 01/13/2022   Lab Results  Component Value Date   TRIG 59 01/13/2022   Lab Results  Component Value Date   CHOLHDL 2.3 01/13/2022  Lab Results  Component Value Date   HGBA1C 5.1 01/13/2022      Assessment & Plan:   Problem List Items Addressed This Visit       Nervous and Auditory   Chronic midline low back pain with right-sided sciatica - Primary    She continues to have pain despite attending physical therapy.  Will do a trial of gabapentin 100 mg 3 times daily, continue Flexeril 5 mg sparingly at bedtime.  Side effects of medication including drowsiness discussed. Continue stretching exercises  - gabapentin (NEURONTIN) 100 MG capsule; Take 1 capsule (100 mg total) by mouth 3 (three) times daily.  Dispense: 90 capsule; Refill: 2 - Ambulatory referral to Orthopedic Surgery - cyclobenzaprine (FLEXERIL) 5 MG tablet; Take 1 tablet (5 mg total) by mouth at bedtime as needed for muscle spasms.  Dispense: 30 tablet; Refill: 0       Relevant Medications   gabapentin (NEURONTIN) 100 MG capsule   cyclobenzaprine (FLEXERIL) 5 MG tablet   Other Relevant Orders   Ambulatory referral to Orthopedic Surgery     Other   Vitamin D deficiency    She still did not pick up the prescription for vitamin D Patient encouraged to pick up the prescription for vitamin D 50,000 units and take as ordered Recheck labs at next visit      Relevant Medications   Vitamin D, Ergocalciferol, (DRISDOL) 1.25 MG (50000 UNIT) CAPS capsule   Elevated lymphocytes    Lab Results  Component Value Date   WBC 8.8 01/13/2022   HGB 13.7 01/13/2022   HCT 39.6 01/13/2022   MCV 93 01/13/2022   PLT 289 01/13/2022  Absolute lymphocytes is elevated at 4.3 but WBC is normal. Patient denies fever, chills, unintentional weight loss , fatigue.  Will recheck labs at next visit.        Meds ordered this encounter  Medications   gabapentin  (NEURONTIN) 100 MG capsule    Sig: Take 1 capsule (100 mg total) by mouth 3 (three) times daily.    Dispense:  90 capsule    Refill:  2   cyclobenzaprine (FLEXERIL) 5 MG tablet    Sig: Take 1 tablet (5 mg total) by mouth at bedtime as needed for muscle spasms.    Dispense:  30 tablet    Refill:  0   Vitamin D, Ergocalciferol, (DRISDOL) 1.25 MG (50000 UNIT) CAPS capsule    Sig: Take 1 capsule (50,000 Units total) by mouth every 7 (seven) days.    Dispense:  6 capsule    Refill:  0    Follow-up: Return in about 3 months (around 07/21/2022) for backnpain, leg pain.    Donell BeersFolashade R , FNP

## 2022-05-01 ENCOUNTER — Encounter: Payer: Self-pay | Admitting: Sports Medicine

## 2022-05-01 ENCOUNTER — Other Ambulatory Visit (INDEPENDENT_AMBULATORY_CARE_PROVIDER_SITE_OTHER): Payer: BLUE CROSS/BLUE SHIELD

## 2022-05-01 ENCOUNTER — Ambulatory Visit (INDEPENDENT_AMBULATORY_CARE_PROVIDER_SITE_OTHER): Payer: BLUE CROSS/BLUE SHIELD | Admitting: Sports Medicine

## 2022-05-01 DIAGNOSIS — M5441 Lumbago with sciatica, right side: Secondary | ICD-10-CM

## 2022-05-01 DIAGNOSIS — G8929 Other chronic pain: Secondary | ICD-10-CM | POA: Diagnosis not present

## 2022-05-01 DIAGNOSIS — M6289 Other specified disorders of muscle: Secondary | ICD-10-CM

## 2022-05-01 MED ORDER — METHYLPREDNISOLONE 4 MG PO TBPK: ORAL_TABLET | ORAL | 0 refills | Status: DC

## 2022-05-01 NOTE — Progress Notes (Signed)
Rebecca Fox - 27 y.o. female MRN [id number]  Date of birth: [birth year]  Office Visit Note: Visit Date: 05/01/2022 PCP: Donell Beers, FNP Referred by: Donell Beers, FNP  Subjective: Chief Complaint  Patient presents with   Right Leg - Pain   HPI: Rebecca Fox is a pleasant 27 y.o. female who presents today for chronic low back pain with RLE radiculopathy.  She has had chronic midline and right-sided low back pain that radiates down the posterior aspect of the leg.  This has been ongoing for about 1.5 to 2 years.  Denies any inciting event or traumatic injury.  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  Also does a lot of standing as she works at [employer], this can exacerbate her pain.  She has tried many conservative measures, over-the-counter anti-inflammatories.  She has completed 6-8 weeks of physical therapy.  Her physical therapy did help, but her pain still remains.  She has worked on postural changes as well.  Feels tightness and shooting pain down the posterior aspect of the thigh into the calf, occasionally has tingling in the toes as well.  Denies any leg weakness, bowel or bladder incontinence.  *Independent review of note from PCP (Paseda, FNP) from 04/21/2022 for her low back pain with radiculopathy.  Did start her on gabapentin 100 mg 3 times daily.  Also provided Flexeril 5 mg to be taken nightly.  We discussed supplementing with vitamin D for her deficiency.  Pertinent ROS were reviewed with the patient and found to be negative unless otherwise specified above in HPI.   Assessment & Plan: Visit Diagnoses:  1. Chronic right-sided low back pain with right-sided sciatica   2. Hamstring tightness of right lower extremity    Plan: Discussed with the nature of her chronic low back pain with radiculitis. She has had this pain for nearly 2 years and has had some improvement with medication, physical therapy and postural changes, however  her pain still remains and she does have provocative symptoms of radiculopathy down the right side.  Fortunately she has no muscle weakness or red flag symptoms.  Given the chronicity and lack of improvement with the above treatments, I think it is pertinent to obtain an MRI of the lumbar spine to rule out bulging disc or neural impingement.  We will also do a trial of methylprednisolone taper pack x 6 days.  She can continue her gabapentin 100 mg 3 times daily and Flexeril 5 mg nightly as needed.  Did encourage her to continue her home exercises that she was shown in physical therapy.  Will follow-up in 3 days after her MRI to review and discuss next steps.  Follow-up: Return for 3 business days after MRI.   Meds & Orders:  Meds ordered this encounter  Medications   methylPREDNISolone (MEDROL DOSEPAK) 4 MG TBPK tablet    Sig: Take per packet instructions. Taper dosing.    Dispense:  1 each    Refill:  0    Orders Placed This Encounter  Procedures   XR Lumbar Spine 2-3 Views   MR Lumbar Spine w/o contrast     Procedures: No procedures performed      Clinical History: No specialty comments available.  She reports that she has never smoked. She has never used smokeless tobacco.  Recent Labs    01/13/22 0943  HGBA1C 5.1    Objective:    Physical Exam  Gen: Well-appearing, in no  acute distress; non-toxic CV: Regular Rate. Well-perfused. Warm.  Resp: Breathing unlabored on room air; no wheezing. Psych: Fluid speech in conversation; appropriate affect; normal thought process Neuro: Sensation intact throughout. No gross coordination deficits.   Ortho Exam - Lumbar spine: No midline spinous process TTP.  There is some right-sided paraspinal hypertonicity of the lower lumbar spine.  There is notable tightness and restriction with right hamstring testing.  No gross scoliosis or soft tissue changes.  5/5 strength of the lower extremity in the L4-S1 nerve root distribution bilaterally.   2+ patellar and Achilles DTR bilaterally.  Positive straight leg raise, positive modified slump's test on the right.  Imaging: XR Lumbar Spine 2-3 Views  Result Date: 05/01/2022 2 views of the lumbar spine including flexion and extension views were ordered and reviewed by myself.  X-rays demonstrate 5 nonrib-bearing vertebra.  There are preserved intervertebral disc spaces.  No significant arthritic change, no acute fracture.  There is no instability with flexion or extension.    Past Medical/Family/Surgical/Social History: Medications & Allergies reviewed per EMR, new medications updated. Patient Active Problem List   Diagnosis Date Noted   Elevated lymphocytes 04/21/2022   Vitamin D deficiency 02/06/2022   Annual physical exam 01/13/2022   Chronic midline low back pain with right-sided sciatica 01/13/2022   Marijuana user 01/13/2022   Past Medical History:  Diagnosis Date   Chronic midline low back pain with right-sided sciatica    Migraine    Vitamin D deficiency    Family History  Problem Relation Age of Onset   Breast cancer Paternal Grandmother    Colon cancer Neg Hx    Cervical cancer Neg Hx    History reviewed. No pertinent surgical history. Social History   Occupational History   Not on file  Tobacco Use   Smoking status: Never   Smokeless tobacco: Never  Vaping Use   Vaping Use: Never used  Substance and Sexual Activity   Alcohol use: Yes    Comment: occasionally   Drug use: Yes    Types: Marijuana   Sexual activity: Yes

## 2022-05-01 NOTE — Progress Notes (Signed)
Right leg pain with some lower back pain. X1.5-2 years. 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 Pain down her buttock down back of leg. Painful to stand long periods. Has been to physical therapy which seemed to help as she was doing it, but then pain comes back.

## 2022-05-13 DIAGNOSIS — Z419 Encounter for procedure for purposes other than remedying health state, unspecified: Secondary | ICD-10-CM | POA: Diagnosis not present

## 2022-05-16 ENCOUNTER — Ambulatory Visit
Admission: RE | Admit: 2022-05-16 | Discharge: 2022-05-16 | Disposition: A | Discharge status: Home / Self Care | Payer: BLUE CROSS/BLUE SHIELD | Source: Ambulatory Visit | Attending: Sports Medicine | Admitting: Sports Medicine

## 2022-05-16 DIAGNOSIS — G8929 Other chronic pain: Secondary | ICD-10-CM

## 2022-05-27 ENCOUNTER — Ambulatory Visit (INDEPENDENT_AMBULATORY_CARE_PROVIDER_SITE_OTHER): Payer: BLUE CROSS/BLUE SHIELD | Admitting: Sports Medicine

## 2022-05-27 ENCOUNTER — Encounter: Payer: Self-pay | Admitting: Sports Medicine

## 2022-05-27 DIAGNOSIS — G8929 Other chronic pain: Secondary | ICD-10-CM

## 2022-05-27 DIAGNOSIS — M5126 Other intervertebral disc displacement, lumbar region: Secondary | ICD-10-CM

## 2022-05-27 DIAGNOSIS — M5441 Lumbago with sciatica, right side: Secondary | ICD-10-CM

## 2022-05-27 NOTE — Progress Notes (Addendum)
Rebecca Fox - 27 y.o. female MRN [id number]  Date of birth: 02-06-1995  Office Visit Note: Visit Date: 05/27/2022 PCP: Donell Beers, FNP Referred by: Donell Beers, FNP  Subjective: Chief Complaint  Patient presents with   Lower Back - Follow-up   HPI: Rebecca Fox is a pleasant 27 y.o. female who presents today for chronic low back pain with RLE radiculopathy.  Having some back pain but more so the radicular and numbness tingling down the right posterior thigh and calf.  Is taking gabapentin once to twice daily during the day, muscle relaxer at nighttime.  Has completed physical therapy, still doing some home exercises but has pulled back.  Pertinent ROS were reviewed with the patient and found to be negative unless otherwise specified above in HPI.   Assessment & Plan: Visit Diagnoses:  1. Chronic right-sided low back pain with right-sided sciatica   2. Protruded lumbar disc    Plan: Discussed with [facility] after review of her MRI this did show a moderate right-sided subarticular disc protrusion at the L5-S1 level that does appear to be impinging upon the descending right S1 nerve root.  Her symptoms and exam are very indicative of this.  She has completed physical therapy, medication without much relief.  At this point, discussed sending her to my partner Dr. Alvester Morin for spinal injection.  Likely would benefit from a transforaminal injection at this location, but we will leave the exact injection up to his discretion.  I do think she needs to get back into formalized physical therapy when she has this done as well to give her the best chance and improvements.  I would like to see her back about 2-3 weeks after the injection to see how she is doing.  She may continue her gabapentin and Flexeril as needed.  Hopefully injection calm things down and we can avoid surgical intervention.  Follow-up: Return for F/u 2-3 weeks after injection and starting back in PT.   Meds &  Orders: No orders of the defined types were placed in this encounter.   Orders Placed This Encounter  Procedures   Ambulatory referral to Physical Medicine Rehab     Procedures: No procedures performed      Clinical History: No specialty comments available.  She reports that she has never smoked. She has never used smokeless tobacco.  Recent Labs    01/13/22 0943  HGBA1C 5.1    Objective:    Physical Exam  Gen: Well-appearing, in no acute distress; non-toxic CV: Regular Rate. Well-perfused. Warm.  Resp: Breathing unlabored on room air; no wheezing. Psych: Fluid speech in conversation; appropriate affect; normal thought process Neuro: Sensation intact throughout. No gross coordination deficits.   Ortho Exam - Lumbar: + SLR.  There is no weakness of bilateral lower extremities in the L4-S1 nerve root distribution bilaterally.  Imaging: MR Lumbar Spine w/o contrast CLINICAL DATA:  Initial evaluation for lower back pain with right lower extremity radicular symptoms.  EXAM: MRI LUMBAR SPINE WITHOUT CONTRAST  TECHNIQUE: Multiplanar, multisequence MR imaging of the lumbar spine was performed. No intravenous contrast was administered.  COMPARISON:  Radiograph from 05/01/2022.  FINDINGS: Segmentation: Standard. Lowest well-formed disc space labeled the L5-S1 level.  Alignment: Straightening of the normal lumbar lordosis. Trace degenerative retrolisthesis of L5 on S1.  Vertebrae: Vertebral body height maintained without acute or chronic fracture. Bone marrow signal intensity within normal limits. No discrete or worrisome osseous lesions or abnormal marrow edema.  Conus medullaris and cauda  equina: Conus extends to the L1 level. Conus and cauda equina appear normal.  Paraspinal and other soft tissues: Unremarkable.  Disc levels:  L1-2:  Unremarkable.  L2-3:  Unremarkable.  L3-4: Normal interspace. Mild bilateral facet hypertrophy. No spinal stenosis. Foramina  remain patent.  L4-5: Normal interspace. Mild bilateral facet hypertrophy. No significant spinal stenosis. Foramina remain patent.  L5-S1: Disc desiccation. Moderate sized right subarticular disc protrusion extends into the right lateral recess, impinging upon the descending right S1 nerve root (series 6, image 38). Superimposed mild facet hypertrophy. Resultant moderate right worse than left lateral recess stenosis. Mild narrowing of the central canal. Foramina remain patent.  IMPRESSION: 1. Moderate sized right subarticular disc protrusion at L5-S1, impinging upon the descending right S1 nerve root. 2. Mild bilateral facet hypertrophy at L3-4 through L5-S1.  Electronically Signed   By: Rise Mu M.D.   On: 05/17/2022 23:00    Past Medical/Family/Surgical/Social History: Medications & Allergies reviewed per EMR, new medications updated. Patient Active Problem List   Diagnosis Date Noted   Elevated lymphocytes 04/21/2022   Vitamin D deficiency 02/06/2022   Annual physical exam 01/13/2022   Chronic midline low back pain with right-sided sciatica 01/13/2022   Marijuana user 01/13/2022   Past Medical History:  Diagnosis Date   Chronic midline low back pain with right-sided sciatica    Migraine    Vitamin D deficiency    Family History  Problem Relation Age of Onset   Breast cancer Paternal Grandmother    Colon cancer Neg Hx    Cervical cancer Neg Hx    History reviewed. No pertinent surgical history. Social History   Occupational History   Not on file  Tobacco Use   Smoking status: Never   Smokeless tobacco: Never  Vaping Use   Vaping Use: Never used  Substance and Sexual Activity   Alcohol use: Yes    Comment: occasionally   Drug use: Yes    Types: Marijuana   Sexual activity: Yes

## 2022-06-12 ENCOUNTER — Telehealth (INDEPENDENT_AMBULATORY_CARE_PROVIDER_SITE_OTHER): Payer: BLUE CROSS/BLUE SHIELD | Admitting: Nurse Practitioner

## 2022-06-12 ENCOUNTER — Encounter: Payer: Self-pay | Admitting: Nurse Practitioner

## 2022-06-12 DIAGNOSIS — G8929 Other chronic pain: Secondary | ICD-10-CM | POA: Diagnosis not present

## 2022-06-12 DIAGNOSIS — M5441 Lumbago with sciatica, right side: Secondary | ICD-10-CM

## 2022-06-12 MED ORDER — GABAPENTIN 300 MG PO CAPS
300.0000 mg | ORAL_CAPSULE | Freq: Three times a day (TID) | ORAL | 3 refills | Status: DC
Start: 2022-06-12 — End: 2022-07-17

## 2022-06-12 NOTE — Assessment & Plan Note (Signed)
1. Chronic midline low back pain with right-sided sciatica  - gabapentin (NEURONTIN) 300 MG capsule; Take 1 capsule (300 mg total) by mouth 3 (three) times daily.  Dispense: 90 capsule; Refill: 3  Continue flexeril 5mg  at bedtime PRN, heating pad PRN Follow up with neurology as planned.   Recent MRI shows 1. Moderate sized right subarticular disc protrusion at L5-S1, impinging upon the descending right S1 nerve root. 2. Mild bilateral facet hypertrophy at L3-4 through L5-S1.

## 2022-06-12 NOTE — Progress Notes (Signed)
Virtual Visit via Telephone Note  I connected with Rebecca Fox @ on 06/12/22 at 335pm by telephone and verified that I am speaking with the correct person using two identifiers.  I spent 7 minutes on this telehealth encounter  Location: Patient: work Provider: home   I discussed the limitations, risks, security and privacy concerns of performing an evaluation and management service by telephone and the availability of in person appointments. I also discussed with the patient that there may be a patient responsible charge related to this service. The patient expressed understanding and agreed to proceed.   History of Present Illness: Rebecca Fox  has a past medical history of Chronic midline low back pain with right-sided sciatica, Migraine, and Vitamin D deficiency.  Patient presents with complaints of her chronic mid and low back pain with right-sided.  States that her pain is worse with sitting and walking.  Currently has sharp pain rated 6/10 radiating from her right  buttock through her posterior right leg, has some tingling on her right foot.  Patient denies fever, chills, urinary or bladder incontinence.  Currently on Flexeril 5 mg at bedtime as needed, gabapentin 100 mg 3 times daily.  She has upcoming appointment with neurology on June 10.  Continue Flexeril 5 mg at bedtime continue Flexeril 5 mg recent MRI shows   Observations/Objective:   Assessment and Plan: Chronic midline low back pain with right-sided sciatica 1. Chronic midline low back pain with right-sided sciatica  - gabapentin (NEURONTIN) 300 MG capsule; Take 1 capsule (300 mg total) by mouth 3 (three) times daily.  Dispense: 90 capsule; Refill: 3  Continue flexeril 5mg  at bedtime PRN, heating pad PRN Follow up with neurology as planned.   Recent MRI shows 1. Moderate sized right subarticular disc protrusion at L5-S1, impinging upon the descending right S1 nerve root. 2. Mild bilateral facet hypertrophy at L3-4  through L5-S1.     Follow Up Instructions:    I discussed the assessment and treatment plan with the patient. The patient was provided an opportunity to ask questions and all were answered. The patient agreed with the plan and demonstrated an understanding of the instructions.   The patient was advised to call back or seek an in-person evaluation if the symptoms worsen or if the condition fails to improve as anticipated.

## 2022-06-13 DIAGNOSIS — Z419 Encounter for procedure for purposes other than remedying health state, unspecified: Secondary | ICD-10-CM | POA: Diagnosis not present

## 2022-06-19 ENCOUNTER — Other Ambulatory Visit: Payer: Self-pay

## 2022-06-19 DIAGNOSIS — G8929 Other chronic pain: Secondary | ICD-10-CM

## 2022-06-19 MED ORDER — CYCLOBENZAPRINE HCL 5 MG PO TABS
5.0000 mg | ORAL_TABLET | Freq: Every evening | ORAL | 0 refills | Status: DC | PRN
Start: 2022-06-19 — End: 2022-07-17

## 2022-06-22 ENCOUNTER — Other Ambulatory Visit: Payer: Self-pay

## 2022-06-22 ENCOUNTER — Ambulatory Visit (INDEPENDENT_AMBULATORY_CARE_PROVIDER_SITE_OTHER): Payer: BLUE CROSS/BLUE SHIELD | Admitting: Physical Medicine and Rehabilitation

## 2022-06-22 VITALS — BP 119/68 | HR 86

## 2022-06-22 DIAGNOSIS — M5416 Radiculopathy, lumbar region: Secondary | ICD-10-CM | POA: Diagnosis not present

## 2022-06-22 DIAGNOSIS — M5116 Intervertebral disc disorders with radiculopathy, lumbar region: Secondary | ICD-10-CM

## 2022-06-22 MED ORDER — METHYLPREDNISOLONE ACETATE 80 MG/ML IJ SUSP
80.0000 mg | Freq: Once | INTRAMUSCULAR | Status: AC
Start: 2022-06-22 — End: 2022-06-22
  Administered 2022-06-22: 80 mg

## 2022-06-22 NOTE — Patient Instructions (Signed)

## 2022-06-22 NOTE — Progress Notes (Signed)
Functional Pain Scale - descriptive words and definitions  Uncomfortable (3)  Pain is present but can complete all ADL's/sleep is slightly affected and passive distraction only gives marginal relief. Mild range order  Average Pain 5   +Driver, -BT, -Dye Allergies.  Lower back pain on right side that radiates into right leg

## 2022-07-04 NOTE — Progress Notes (Signed)
Rebecca Fox - 27 y.o. female MRN [id number]  Date of birth: January 24, 1995  Office Visit Note: Visit Date: 06/22/2022 PCP: Donell Beers, FNP Referred by: Madelyn Brunner, DO  Subjective: Chief Complaint  Patient presents with   Lower Back - Pain   HPI:  Rebecca Fox is a 27 y.o. female who comes in today at the request of Dr. Madelyn Brunner for planned Right S1-2 Lumbar Transforaminal epidural steroid injection with fluoroscopic guidance.  The patient has failed conservative care including home exercise, medications, time and activity modification.  This injection will be diagnostic and hopefully therapeutic.  Please see requesting physician notes for further details and justification.    ROS Otherwise per HPI.  Assessment & Plan: Visit Diagnoses:    ICD-10-CM   1. Lumbar radiculopathy  M54.16 XR C-ARM NO REPORT    Epidural Steroid injection    methylPREDNISolone acetate (DEPO-MEDROL) injection 80 mg    2. Radiculopathy due to lumbar intervertebral disc disorder  M51.16       Plan: No additional findings.   Meds & Orders:  Meds ordered this encounter  Medications   methylPREDNISolone acetate (DEPO-MEDROL) injection 80 mg    Orders Placed This Encounter  Procedures   XR C-ARM NO REPORT   Epidural Steroid injection    Follow-up: Return for visit to requesting provider as needed.   Procedures: No procedures performed  S1 Lumbosacral Transforaminal Epidural Steroid Injection - Sub-Pedicular Approach with Fluoroscopic Guidance   Patient: Rebecca Fox      Date of Birth: [birth year] MRN: [id number] PCP: Donell Beers, FNP      Visit Date: 06/22/2022   Universal Protocol:    Date/Time: 07/03/2409:42 AM  Consent Given By: the patient  Position:  PRONE  Additional Comments: Vital signs were monitored before and after the procedure. Patient was prepped and draped in the usual sterile fashion. The correct patient, procedure, and site was  verified.   Injection Procedure Details:  Procedure Site One Meds Administered:  Meds ordered this encounter  Medications   methylPREDNISolone acetate (DEPO-MEDROL) injection 80 mg    Laterality: Right  Location/Site:  S1 Foramen   Needle size: 22 ga.  Needle type: Spinal  Needle Placement: Transforaminal  Findings:   -Comments: Excellent flow of contrast along the nerve, nerve root and into the epidural space.  Epidurogram: Contrast epidurogram showed no nerve root cut off or restricted flow pattern.  Procedure Details: After squaring off the sacral end-plate to get a true AP view, the C-arm was positioned so that the best possible view of the S1 foramen was visualized. The soft tissues overlying this structure were infiltrated with 2-3 ml. of 1% Lidocaine without Epinephrine.    The spinal needle was inserted toward the target using a "trajectory" view along the fluoroscope beam.  Under AP and lateral visualization, the needle was advanced so it did not puncture dura. Biplanar projections were used to confirm position. Aspiration was confirmed to be negative for CSF and/or blood. A 1-2 ml. volume of Isovue-250 was injected and flow of contrast was noted at each level. Radiographs were obtained for documentation purposes.   After attaining the desired flow of contrast documented above, a 0.5 to 1.0 ml test dose of 0.25% Marcaine was injected into each respective transforaminal space.  The patient was observed for 90 seconds post injection.  After no sensory deficits were reported, and normal lower extremity motor function was noted,   the above injectate was administered so that equal amounts  of the injectate were placed at each foramen (level) into the transforaminal epidural space.   Additional Comments:  No complications occurred Dressing: Band-Aid with 2 x 2 sterile gauze    Post-procedure details: Patient was observed during the procedure. Post-procedure instructions  were reviewed.  Patient left the clinic in stable condition.   Clinical History: EXAM: MRI LUMBAR SPINE WITHOUT CONTRAST   TECHNIQUE: Multiplanar, multisequence MR imaging of the lumbar spine was performed. No intravenous contrast was administered.   COMPARISON:  Radiograph from 05/01/2022.   FINDINGS: Segmentation: Standard. Lowest well-formed disc space labeled the L5-S1 level.   Alignment: Straightening of the normal lumbar lordosis. Trace degenerative retrolisthesis of L5 on S1.   Vertebrae: Vertebral body height maintained without acute or chronic fracture. Bone marrow signal intensity within normal limits. No discrete or worrisome osseous lesions or abnormal marrow edema.   Conus medullaris and cauda equina: Conus extends to the L1 level. Conus and cauda equina appear normal.   Paraspinal and other soft tissues: Unremarkable.   Disc levels:   L1-2:  Unremarkable.   L2-3:  Unremarkable.   L3-4: Normal interspace. Mild bilateral facet hypertrophy. No spinal stenosis. Foramina remain patent.   L4-5: Normal interspace. Mild bilateral facet hypertrophy. No significant spinal stenosis. Foramina remain patent.   L5-S1: Disc desiccation. Moderate sized right subarticular disc protrusion extends into the right lateral recess, impinging upon the descending right S1 nerve root (series 6, image 38). Superimposed mild facet hypertrophy. Resultant moderate right worse than left lateral recess stenosis. Mild narrowing of the central canal. Foramina remain patent.   IMPRESSION: 1. Moderate sized right subarticular disc protrusion at L5-S1, impinging upon the descending right S1 nerve root. 2. Mild bilateral facet hypertrophy at L3-4 through L5-S1.     Electronically Signed   By: Rise Mu M.D.   On: 05/17/2022 23:00     Objective:  VS:  HT:    WT:   BMI:     BP:119/68  HR:86bpm  TEMP: ( )  RESP:  Physical Exam Vitals and nursing note reviewed.   Constitutional:      General: She is not in acute distress.    Appearance: Normal appearance. She is not ill-appearing.  HENT:     Head: Normocephalic and atraumatic.     Right Ear: External ear normal.     Left Ear: External ear normal.  Eyes:     Extraocular Movements: Extraocular movements intact.  Cardiovascular:     Rate and Rhythm: Normal rate.     Pulses: Normal pulses.  Pulmonary:     Effort: Pulmonary effort is normal. No respiratory distress.  Abdominal:     General: There is no distension.     Palpations: Abdomen is soft.  Musculoskeletal:        General: Tenderness present.     Cervical back: Neck supple.     Right lower leg: No edema.     Left lower leg: No edema.     Comments: Patient has good distal strength with no pain over the greater trochanters.  No clonus or focal weakness.  Skin:    Findings: No erythema, lesion or rash.  Neurological:     General: No focal deficit present.     Mental Status: She is alert and oriented to person, place, and time.     Sensory: No sensory deficit.     Motor: No weakness or abnormal muscle tone.     Coordination: Coordination normal.  Psychiatric:  Mood and Affect: Mood normal.        Behavior: Behavior normal.      Imaging: No results found.

## 2022-07-04 NOTE — Procedures (Signed)
S1 Lumbosacral Transforaminal Epidural Steroid Injection - Sub-Pedicular Approach with Fluoroscopic Guidance   Patient: Rebecca Fox      Date of Birth: [birth year] MRN: [id number] PCP: Donell Beers, FNP      Visit Date: 06/22/2022   Universal Protocol:    Date/Time: 07/03/2409:42 AM  Consent Given By: the patient  Position:  PRONE  Additional Comments: Vital signs were monitored before and after the procedure. Patient was prepped and draped in the usual sterile fashion. The correct patient, procedure, and site was verified.   Injection Procedure Details:  Procedure Site One Meds Administered:  Meds ordered this encounter  Medications   methylPREDNISolone acetate (DEPO-MEDROL) injection 80 mg    Laterality: Right  Location/Site:  S1 Foramen   Needle size: 22 ga.  Needle type: Spinal  Needle Placement: Transforaminal  Findings:   -Comments: Excellent flow of contrast along the nerve, nerve root and into the epidural space.  Epidurogram: Contrast epidurogram showed no nerve root cut off or restricted flow pattern.  Procedure Details: After squaring off the sacral end-plate to get a true AP view, the C-arm was positioned so that the best possible view of the S1 foramen was visualized. The soft tissues overlying this structure were infiltrated with 2-3 ml. of 1% Lidocaine without Epinephrine.    The spinal needle was inserted toward the target using a "trajectory" view along the fluoroscope beam.  Under AP and lateral visualization, the needle was advanced so it did not puncture dura. Biplanar projections were used to confirm position. Aspiration was confirmed to be negative for CSF and/or blood. A 1-2 ml. volume of Isovue-250 was injected and flow of contrast was noted at each level. Radiographs were obtained for documentation purposes.   After attaining the desired flow of contrast documented above, a 0.5 to 1.0 ml test dose of 0.25% Marcaine was injected into  each respective transforaminal space.  The patient was observed for 90 seconds post injection.  After no sensory deficits were reported, and normal lower extremity motor function was noted,   the above injectate was administered so that equal amounts of the injectate were placed at each foramen (level) into the transforaminal epidural space.   Additional Comments:  No complications occurred Dressing: Band-Aid with 2 x 2 sterile gauze    Post-procedure details: Patient was observed during the procedure. Post-procedure instructions were reviewed.  Patient left the clinic in stable condition.

## 2022-07-13 DIAGNOSIS — Z419 Encounter for procedure for purposes other than remedying health state, unspecified: Secondary | ICD-10-CM | POA: Diagnosis not present

## 2022-07-17 ENCOUNTER — Ambulatory Visit (INDEPENDENT_AMBULATORY_CARE_PROVIDER_SITE_OTHER): Payer: BLUE CROSS/BLUE SHIELD | Admitting: Nurse Practitioner

## 2022-07-17 ENCOUNTER — Encounter: Payer: Self-pay | Admitting: Nurse Practitioner

## 2022-07-17 VITALS — BP 119/77 | HR 71 | Ht 70.0 in | Wt 189.6 lb

## 2022-07-17 DIAGNOSIS — M5116 Intervertebral disc disorders with radiculopathy, lumbar region: Secondary | ICD-10-CM | POA: Insufficient documentation

## 2022-07-17 DIAGNOSIS — G8929 Other chronic pain: Secondary | ICD-10-CM

## 2022-07-17 MED ORDER — GABAPENTIN 600 MG PO TABS
600.0000 mg | ORAL_TABLET | Freq: Three times a day (TID) | ORAL | 1 refills | Status: DC
Start: 2022-07-17 — End: 2022-08-18

## 2022-07-17 MED ORDER — KETOROLAC TROMETHAMINE 60 MG/2ML IM SOLN
60.0000 mg | Freq: Once | INTRAMUSCULAR | Status: AC
Start: 2022-07-17 — End: 2022-07-17
  Administered 2022-07-17: 60 mg via INTRAMUSCULAR

## 2022-07-17 MED ORDER — CYCLOBENZAPRINE HCL 10 MG PO TABS
10.0000 mg | ORAL_TABLET | Freq: Every day | ORAL | 1 refills | Status: DC
Start: 2022-07-17 — End: 2022-08-18

## 2022-07-17 NOTE — Assessment & Plan Note (Signed)
She receivedmethylPREDNISolone acetate (DEPO-MEDROL) injection 80 mg lumbar injection about a month ago. She continues to have pain Patient encouraged to follow-up with orthopedics, she is considering surgery if it will help her pain Start gabapentin 600 mg 3 times daily, Flexeril 10 mg at bedtime as needed, may take 3 times daily as needed if needed.  Side effects of drowsiness discussed Toradol 60 mg IM injection given in the office today

## 2022-07-17 NOTE — Progress Notes (Signed)
Acute Office Visit  Subjective:     Patient ID: Rebecca Fox, female    DOB: July 19, 1995, 27 y.o.   MRN: [id number]  Chief Complaint  Patient presents with   Leg Pain   Back Pain    HPI Rebecca Fox  has a past medical history of Chronic midline low back pain with right-sided sciatica, Lumbar radiculopathy, Migraine, and Vitamin D deficiency.  Patient presents for complaints of ongoing chronic low back pain, burning tingling sensation running down her calf to her right foot.  She had a spinal injection recently, stated that she felt better for 2 days but her pain has since returned.  Currently has aching pain rated 8/10.  Takes gabapentin 300 mg 3 times daily, Flexeril 5 mg as needed at bedtime. Orthopedics plan for in 4 weeks to 3 weeks after her spinal injection but she has not followed up with them.      Review of Systems  Constitutional:  Negative for activity change, appetite change, chills, fatigue and fever.  HENT:  Negative for congestion, dental problem, ear discharge, ear pain, hearing loss, rhinorrhea, sinus pressure, sinus pain, sneezing and sore throat.   Eyes:  Negative for pain, discharge, redness and itching.  Respiratory:  Negative for cough, chest tightness, shortness of breath and wheezing.   Cardiovascular:  Negative for chest pain, palpitations and leg swelling.  Gastrointestinal:  Negative for abdominal distention, abdominal pain, anal bleeding, blood in stool, constipation, diarrhea, nausea, rectal pain and vomiting.  Endocrine: Negative for cold intolerance, heat intolerance, polydipsia, polyphagia and polyuria.  Genitourinary:  Negative for difficulty urinating, dysuria, flank pain, frequency, hematuria, menstrual problem, pelvic pain and vaginal bleeding.  Musculoskeletal:  Positive for arthralgias and back pain. Negative for gait problem, joint swelling and myalgias.  Skin:  Negative for color change, pallor, rash and wound.  Allergic/Immunologic: Negative  for environmental allergies, food allergies and immunocompromised state.  Neurological:  Positive for numbness. Negative for dizziness, tremors, facial asymmetry, weakness and headaches.  Hematological:  Negative for adenopathy. Does not bruise/bleed easily.  Psychiatric/Behavioral:  Negative for agitation, behavioral problems, confusion, decreased concentration, hallucinations, self-injury and suicidal ideas.         Objective:    BP 119/77   Pulse 71   Ht 5\' 10"  (1.778 m)   Wt 189 lb 9.6 oz (86 kg)   SpO2 98%   BMI 27.20 kg/m    Physical Exam Vitals and nursing note reviewed.  Constitutional:      General: She is not in acute distress.    Appearance: Normal appearance. She is not ill-appearing, toxic-appearing or diaphoretic.  HENT:     Mouth/Throat:     Mouth: Mucous membranes are moist.     Pharynx: Oropharynx is clear. No oropharyngeal exudate or posterior oropharyngeal erythema.  Eyes:     General: No scleral icterus.       Right eye: No discharge.        Left eye: No discharge.     Extraocular Movements: Extraocular movements intact.     Conjunctiva/sclera: Conjunctivae normal.  Cardiovascular:     Rate and Rhythm: Normal rate and regular rhythm.     Pulses: Normal pulses.     Heart sounds: Normal heart sounds. No murmur heard.    No friction rub. No gallop.  Pulmonary:     Effort: Pulmonary effort is normal. No respiratory distress.     Breath sounds: Normal breath sounds. No stridor. No wheezing, rhonchi or rales.  Chest:  Chest wall: No tenderness.  Abdominal:     General: There is no distension.     Palpations: Abdomen is soft.     Tenderness: There is no abdominal tenderness. There is no right CVA tenderness, left CVA tenderness or guarding.  Musculoskeletal:        General: No swelling, deformity or signs of injury.     Right lower leg: No edema.     Left lower leg: No edema.  Skin:    General: Skin is warm and dry.     Capillary Refill: Capillary  refill takes less than 2 seconds.     Coloration: Skin is not jaundiced or pale.     Findings: No bruising, erythema or lesion.  Neurological:     Mental Status: She is alert and oriented to person, place, and time.     Motor: No weakness.     Coordination: Coordination normal.     Gait: Gait normal.  Psychiatric:        Mood and Affect: Mood normal.        Behavior: Behavior normal.        Thought Content: Thought content normal.        Judgment: Judgment normal.     No results found for any visits on 07/17/22.      Assessment & Plan:   Problem List Items Addressed This Visit       Nervous and Auditory   Chronic midline low back pain with right-sided sciatica - Primary    Patient encouraged to follow-up with orthopedics, she is considering surgery if it will help her pain Start gabapentin 600 mg 3 times daily, Flexeril 10 mg at bedtime as needed, may take 3 times daily as needed if needed.  Side effects of drowsiness discussed Toradol 60 mg IM injection given in the office today      Relevant Medications   gabapentin (NEURONTIN) 600 MG tablet   cyclobenzaprine (FLEXERIL) 10 MG tablet   Radiculopathy due to lumbar intervertebral disc disorder    She receivedmethylPREDNISolone acetate (DEPO-MEDROL) injection 80 mg lumbar injection about a month ago. She continues to have pain Patient encouraged to follow-up with orthopedics, she is considering surgery if it will help her pain Start gabapentin 600 mg 3 times daily, Flexeril 10 mg at bedtime as needed, may take 3 times daily as needed if needed.  Side effects of drowsiness discussed Toradol 60 mg IM injection given in the office today      Relevant Medications   gabapentin (NEURONTIN) 600 MG tablet   cyclobenzaprine (FLEXERIL) 10 MG tablet    Meds ordered this encounter  Medications   gabapentin (NEURONTIN) 600 MG tablet    Sig: Take 1 tablet (600 mg total) by mouth 3 (three) times daily.    Dispense:  90 tablet     Refill:  1   cyclobenzaprine (FLEXERIL) 10 MG tablet    Sig: Take 1 tablet (10 mg total) by mouth at bedtime.    Dispense:  30 tablet    Refill:  1   ketorolac (TORADOL) injection 60 mg    Return in about 6 months (around 01/17/2023) for CPE.  Donell Beers, FNP

## 2022-07-17 NOTE — Patient Instructions (Signed)
1. Chronic midline low back pain with right-sided sciatica  - gabapentin (NEURONTIN) 600 MG tablet; Take 1 tablet (600 mg total) by mouth 3 (three) times daily.  Dispense: 90 tablet; Refill: 1 - cyclobenzaprine (FLEXERIL) 10 MG tablet; Take 1 tablet (10 mg total) by mouth at bedtime.  Dispense: 30 tablet; Refill: 1   You were given Toradol 60 mg injection in the office today    It is important that you exercise regularly at least 30 minutes 5 times a week as tolerated  Think about what you will eat, plan ahead. Choose " clean, green, fresh or frozen" over canned, processed or packaged foods which are more sugary, salty and fatty. 70 to 75% of food eaten should be vegetables and fruit. Three meals at set times with snacks allowed between meals, but they must be fruit or vegetables. Aim to eat over a 12 hour period , example 7 am to 7 pm, and STOP after  your last meal of the day. Drink water,generally about 64 ounces per day, no other drink is as healthy. Fruit juice is best enjoyed in a healthy way, by EATING the fruit.  Thanks for choosing [facility] we consider it a privelige to serve you.

## 2022-07-17 NOTE — Assessment & Plan Note (Signed)
Patient encouraged to follow-up with orthopedics, she is considering surgery if it will help her pain Start gabapentin 600 mg 3 times daily, Flexeril 10 mg at bedtime as needed, may take 3 times daily as needed if needed.  Side effects of drowsiness discussed Toradol 60 mg IM injection given in the office today

## 2022-07-20 ENCOUNTER — Ambulatory Visit (INDEPENDENT_AMBULATORY_CARE_PROVIDER_SITE_OTHER): Payer: BLUE CROSS/BLUE SHIELD | Admitting: Student

## 2022-07-20 ENCOUNTER — Other Ambulatory Visit (HOSPITAL_BASED_OUTPATIENT_CLINIC_OR_DEPARTMENT_OTHER): Payer: Self-pay

## 2022-07-20 ENCOUNTER — Encounter (HOSPITAL_BASED_OUTPATIENT_CLINIC_OR_DEPARTMENT_OTHER): Payer: Self-pay | Admitting: Student

## 2022-07-20 DIAGNOSIS — G8929 Other chronic pain: Secondary | ICD-10-CM | POA: Diagnosis not present

## 2022-07-20 DIAGNOSIS — M5441 Lumbago with sciatica, right side: Secondary | ICD-10-CM

## 2022-07-20 MED ORDER — TRAMADOL HCL 50 MG PO TABS
50.0000 mg | ORAL_TABLET | Freq: Four times a day (QID) | ORAL | 0 refills | Status: DC | PRN
  Filled 2022-07-20: qty 20, 5d supply, fill #0

## 2022-07-20 NOTE — Progress Notes (Signed)
Chief Complaint: Low back and right leg pain     History of Present Illness:    Rebecca Fox is a 27 y.o. female presenting today for evaluation of right-sided low back pain radiating into the right leg.  She states that she has been unable to sleep and has extreme difficulty standing up.  Pain today is 10 out of 10 and radiates all the way down the right leg into the foot with associated numbness and tingling.  She was seen 3 days ago for evaluation and was given a Toradol injection which gave no relief.  Does have chronic low back pain and did receive an epidural cortisone injection on 06/22/2022 with Dr. Alvester Morin which only gave temporary relief.   Has been taking Flexeril 10 mg, gabapentin 600 mg three times daily, and ibuprofen 800 mg without any relief. Denies any bowel/bladder dysfunction.   Surgical History:   None  PMH/PSH/Family History/Social History/Meds/Allergies:    Past Medical History:  Diagnosis Date   Chronic midline low back pain with right-sided sciatica    Lumbar radiculopathy    Migraine    Vitamin D deficiency    History reviewed. No pertinent surgical history. Social History   Socioeconomic History   Marital status: Single    Spouse name: Not on file   Number of children: Not on file   Years of education: Not on file   Highest education level: Not on file  Occupational History   Not on file  Tobacco Use   Smoking status: Never   Smokeless tobacco: Never  Vaping Use   Vaping Use: Never used  Substance and Sexual Activity   Alcohol use: Yes    Comment: occasionally   Drug use: Yes    Types: Marijuana   Sexual activity: Yes  Other Topics Concern   Not on file  Social History Narrative   Lives with her mother   Social Determinants of Health   Financial Resource Strain: Not on file  Food Insecurity: Not on file  Transportation Needs: Not on file  Physical Activity: Not on file  Stress: Not on file  Social  Connections: Not on file   Family History  Problem Relation Age of Onset   Breast cancer Paternal Grandmother    Colon cancer Neg Hx    Cervical cancer Neg Hx    No Known Allergies Current Outpatient Medications  Medication Sig Dispense Refill   traMADol (ULTRAM) 50 MG tablet Take 1 tablet (50 mg total) by mouth every 6 (six) hours as needed for up to 5 days. 20 tablet 0   cyclobenzaprine (FLEXERIL) 10 MG tablet Take 1 tablet (10 mg total) by mouth at bedtime. 30 tablet 1   gabapentin (NEURONTIN) 600 MG tablet Take 1 tablet (600 mg total) by mouth 3 (three) times daily. 90 tablet 1   ibuprofen (ADVIL) 800 MG tablet TAKE 1 TABLET BY MOUTH EVERY 8 HOURS AS NEEDED (Patient not taking: Reported on 04/21/2022) 30 tablet 0   methylPREDNISolone (MEDROL DOSEPAK) 4 MG TBPK tablet Take per packet instructions. Taper dosing. (Patient not taking: Reported on 06/12/2022) 1 each 0   Vitamin D, Ergocalciferol, (DRISDOL) 1.25 MG (50000 UNIT) CAPS capsule Take 1 capsule (50,000 Units total) by mouth every 7 (seven) days. (Patient not taking: Reported on 07/17/2022) 6 capsule 0  No current facility-administered medications for this visit.   No results found.  Review of Systems:   A ROS was performed including pertinent positives and negatives as documented in the HPI.  Physical Exam :   Constitutional: NAD and appears stated age Neurological: Alert and oriented Psych: Appropriate affect and cooperative There were no vitals taken for this visit.   Comprehensive Musculoskeletal Exam:    Patient appears uncomfortable sitting on exam table and is getting around with use of a wheelchair.  Mild tenderness to palpation over the lumbar spine.  5/5 strength with knee flexion extension as well as ankle dorsiflexion and plantarflexion.  Imaging:     Assessment:   27 y.o. female with right sided low back pain and RLE radiculopathy.  She is experiencing severe pain that has not been improved with Toradol,  Flexeril, gabapentin, or ibuprofen.  For this reason I will write her some tramadol for the next few days to take as needed.  Patient also works at [employer] which requires long periods of standing and lifting, so I will hold her out for the rest of this week.  At this point I would like to get her referred over to our spine specialist Dr. Christell Constant for further evaluation as she has already had MRI and injections.  MRI from 05/16/22 revealed a moderate right subarticular disc protrusion at L5-S1.  Plan :    -Referral to Dr. Christell Constant for further evaluation and discussion of treatment options -Begin tramadol 50 mg for pain relief     I personally saw and evaluated the patient, and participated in the management and treatment plan.  Hazle Nordmann, PA-C Orthopedics  This document was dictated using Conservation officer, historic buildings. A reasonable attempt at proof reading has been made to minimize errors.

## 2022-07-21 ENCOUNTER — Ambulatory Visit: Payer: Self-pay | Admitting: Nurse Practitioner

## 2022-07-22 ENCOUNTER — Ambulatory Visit (INDEPENDENT_AMBULATORY_CARE_PROVIDER_SITE_OTHER): Payer: BLUE CROSS/BLUE SHIELD | Admitting: Orthopedic Surgery

## 2022-07-22 ENCOUNTER — Other Ambulatory Visit (INDEPENDENT_AMBULATORY_CARE_PROVIDER_SITE_OTHER): Payer: BLUE CROSS/BLUE SHIELD

## 2022-07-22 ENCOUNTER — Encounter: Payer: Self-pay | Admitting: Orthopedic Surgery

## 2022-07-22 DIAGNOSIS — M5416 Radiculopathy, lumbar region: Secondary | ICD-10-CM | POA: Diagnosis not present

## 2022-07-22 MED ORDER — TRAMADOL HCL 50 MG PO TABS: 50.0000 mg | ORAL_TABLET | Freq: Four times a day (QID) | ORAL | 0 refills | Status: AC | PRN

## 2022-07-22 NOTE — Progress Notes (Signed)
Orthopedic Spine Surgery Office Note  Assessment: Patient is a 27 y.o. female with L5/S1 herniated disc causing radiculopathy that has not improved with conservative treatments   Plan: -Patient was previously prescribed tramadol to help with pain.she found this helpful, so prescribed more tramadol -Patient has tried PT, Tylenol, ibuprofen, tramadol, lumbar steroid injection -Patient has tried conservative treatments for several months now without any significant relief.  In fact, her pain has gotten worse with time.  Accordingly, discussed L5/S1 microdiscectomy as a treatment option for her.  After discussing this, she elected to proceed -Patient will next be seen at date of surgery   The patient has right-sided radiculopathy due to a L5/S1 disc herniation. I explained to the patient and her mother that most of the time, the symptoms related to herniated disc get better with conservative treatments. This patient has tried conservative treatment now for over 6 months without any relief of her symptoms. Accordingly, discussed surgery in the form of L5/S1 microdiscectomy as an option for treatment.  The risks of the surgery including but not limited to recurrent disc herniation, persistent pain, dural tear, nerve root injury, spinal cord injury, infection, bleeding, fracture, instability, need for additional procedures, heart attack, stroke, DVT/PE, and death were discussed with the patient. The benefits of the surgery would be faster relief of the patient's symptoms that are due to the herniated disc which would be the right leg pain. I explained that back pain relief is not the goal of the surgery and it is not reliably alleviated with this surgery.  I also informed her and her mother that, since symptoms have been going on for 2 years now, it is unlikely that she will be pain-free in the right leg.  The alternatives to surgical management were covered with the patient and included activity modification,  physical therapy, over-the-counter pain medications, and injections.  All the patient's questions were answered to her and her mother's satisfaction. After this discussion, the patient expressed understanding and elected to proceed with surgical intervention.    ___________________________________________________________________________   History:  Patient is a 27 y.o. female who presents today for lumbar spine.  Patient has had 2 years of right leg pain.  There is no trauma or injury that preceded the onset of pain.  Pain is felt along the posterior aspect of the thigh and posterior lateral aspect of the leg.  She does not have any pain in the left lower extremity.  Pain has gotten worse within the last month.  She has not been able to work due to pain.  She has not noticed lasting relief with any of the conservative treatments tried.  She does get numbness and paresthesias along the posterior aspect of the right leg.  No other numbness or paresthesias.   Weakness: Yes, right leg feels weaker.  No other weakness noted Symptoms of imbalance: Denies Paresthesias and numbness: Yes, along the posterior aspect of the right leg Bowel or bladder incontinence: Denies Saddle anesthesia: Denies  Treatments tried: PT, Tylenol, ibuprofen, tramadol, lumbar steroid injection  Review of systems: Denies fevers and chills, night sweats, unexplained weight loss, history of cancer.  Has had pain that wakes her at night  Past medical history: Anxiety/depression Chronic pain  Allergies: NKDA  Past surgical history:  None  Social history: Denies use of nicotine product (smoking, vaping, patches, smokeless) Alcohol use: Rare Denies recreational drug use   Physical Exam:  BMI of 27.2  General: no acute distress, appears stated age, tearful in  office due to pain Neurologic: alert, answering questions appropriately, following commands Respiratory: unlabored breathing on room air, symmetric chest  rise Psychiatric: appropriate affect, normal cadence to speech   MSK (spine):  -Strength exam      Left  Right EHL    5/5  5/5 TA    5/5  5/5 GSC    5/5  5/5 Knee extension  5/5  4/5 Hip flexion   5/5  4/5  Hip flexion and knee extension on the right seems limited by pain  -Sensory exam    Sensation intact to light touch in L3-S1 nerve distributions of bilateral lower extremities  -Achilles DTR: 2/4 on the left, 2/4 on the right -Patellar tendon DTR: 2/4 on the left, 2/4 on the right  -Straight leg raise: Positive on the right, negative on the left -Femoral nerve stretch test: Negative bilaterally -Clonus: no beats bilaterally  -Left hip exam: No pain through range of motion, negative Stinchfield, negative FABER -Right hip exam: No pain through range of motion, negative Stinchfield, negative FABER  Imaging: XR of the lumbar spine from 07/22/2022 and 05/01/2022 were independently reviewed and interpreted, showing disc height loss at L5/S1.  No other significant degenerative changes.  No evidence of instability on flexion/extension views.  No fracture or dislocation seen.  MRI of the lumbar spine from 05/16/2022 was independently reviewed and interpreted, showing disc desiccation at L5/S1.  There is a central disc herniation at L5/S1 with greater herniation on the right side.  Lateral recess stenosis on the right at L5/S1.   Patient name: Rebecca Fox Patient MRN: [id number] Date of visit: 07/22/22

## 2022-07-24 ENCOUNTER — Telehealth: Payer: Self-pay | Admitting: Orthopedic Surgery

## 2022-07-24 NOTE — Telephone Encounter (Signed)
Patient's mother is requesting a call to discuss patient's surgery. Patient and mother were under the impression patient was being scheduled for 7/30. Unfortunately first available is either 08/18/22 (a Tuesday) or 8/12. Mother insists this is an Urgent case, and patient cannot wait until then. Please call to discuss.

## 2022-07-28 ENCOUNTER — Ambulatory Visit: Payer: Self-pay | Admitting: Nurse Practitioner

## 2022-07-28 NOTE — Telephone Encounter (Signed)
I spoke with patient and advised her she is scheduled for surgery on 08/18/22 at [facility].

## 2022-08-10 NOTE — Pre-Procedure Instructions (Signed)
Surgical Instructions   Your procedure is scheduled on August 18, 2022. Report to [facility] Main Entrance "A" at 5:30 A.M., then check in with the Admitting office. Any questions or running late day of surgery: call [phone]  Questions prior to your surgery date: call [phone], Monday-Friday, 8am-4pm. If you experience any cold or flu symptoms such as cough, fever, chills, shortness of breath, etc. between now and your scheduled surgery, please notify us at the above number.     Remember:  Do not eat after midnight the night before your surgery  You may drink clear liquids until 4:30 AM the morning of your surgery.   Clear liquids allowed are: Water, Non-Citrus Juices (without pulp), Carbonated Beverages, Clear Tea, Black Coffee Only (NO MILK, CREAM OR POWDERED CREAMER of any kind), and Gatorade.  Patient Instructions  The night before surgery:  No food after midnight. ONLY clear liquids after midnight  The day of surgery (if you do NOT have diabetes):  Drink ONE (1) Pre-Surgery Clear Ensure by 4:30 AM the morning of surgery. Drink in one sitting. Do not sip.  This drink was given to you during your hospital  pre-op appointment visit.  Nothing else to drink after completing the  Pre-Surgery Clear Ensure.         If you have questions, please contact your surgeon's office.     Take these medicines the morning of surgery with A SIP OF WATER: traMADol (ULTRAM) - may take if needed    One week prior to surgery, STOP taking any Aspirin (unless otherwise instructed by your surgeon) Aleve, Naproxen, Ibuprofen, Motrin, Advil, Goody's, BC's, all herbal medications, fish oil, and non-prescription vitamins.                     Do NOT Smoke (Tobacco/Vaping) for 24 hours prior to your procedure.  If you use a CPAP at night, you may bring your mask/headgear for your overnight stay.   You will be asked to remove any contacts, glasses, piercing's, hearing aid's,  dentures/partials prior to surgery. Please bring cases for these items if needed.    Patients discharged the day of surgery will not be allowed to drive home, and someone needs to stay with them for 24 hours.  SURGICAL WAITING ROOM VISITATION Patients may have no more than 2 support people in the waiting area - these visitors may rotate.   Pre-op nurse will coordinate an appropriate time for 1 ADULT support person, who may not rotate, to accompany patient in pre-op.  Children under the age of 89 must have an adult with them who is not the patient and must remain in the main waiting area with an adult.  If the patient needs to stay at the hospital during part of their recovery, the visitor guidelines for inpatient rooms apply.  Please refer to the [facility] website for the visitor guidelines for any additional information.   If you received a COVID test during your pre-op visit  it is requested that you wear a mask when out in public, stay away from anyone that may not be feeling well and notify your surgeon if you develop symptoms. If you have been in contact with anyone that has tested positive in the last 10 days please notify you surgeon.      Pre-operative 5 CHG Bathing Instructions   You can play a key role in reducing the risk of infection after surgery. Your skin needs to be as free of  germs as possible. You can reduce the number of germs on your skin by washing with CHG (chlorhexidine gluconate) soap before surgery. CHG is an antiseptic soap that kills germs and continues to kill germs even after washing.   DO NOT use if you have an allergy to chlorhexidine/CHG or antibacterial soaps. If your skin becomes reddened or irritated, stop using the CHG and notify one of our RNs at [phone].   Please shower with the CHG soap starting 4 days before surgery using the following schedule:     Please keep in mind the following:  DO NOT shave, including legs and underarms, starting the  day of your first shower.   You may shave your face at any point before/day of surgery.  Place clean sheets on your bed the day you start using CHG soap. Use a clean washcloth (not used since being washed) for each shower. DO NOT sleep with pets once you start using the CHG.   CHG Shower Instructions:  If you choose to wash your hair and private area, wash first with your normal shampoo/soap.  After you use shampoo/soap, rinse your hair and body thoroughly to remove shampoo/soap residue.  Turn the water OFF and apply about 3 tablespoons (45 ml) of CHG soap to a CLEAN washcloth.  Apply CHG soap ONLY FROM YOUR NECK DOWN TO YOUR TOES (washing for 3-5 minutes)  DO NOT use CHG soap on face, private areas, open wounds, or sores.  Pay special attention to the area where your surgery is being performed.  If you are having back surgery, having someone wash your back for you may be helpful. Wait 2 minutes after CHG soap is applied, then you may rinse off the CHG soap.  Pat dry with a clean towel  Put on clean clothes/pajamas   If you choose to wear lotion, please use ONLY the CHG-compatible lotions on the back of this paper.   Additional instructions for the day of surgery: DO NOT APPLY any lotions, deodorants, cologne, or perfumes.   Do not bring valuables to the hospital. [facility] is not responsible for any belongings/valuables. Do not wear nail polish, gel polish, artificial nails, or any other type of covering on natural nails (fingers and toes) Do not wear jewelry or makeup Put on clean/comfortable clothes.  Please brush your teeth.  Ask your nurse before applying any prescription medications to the skin.     CHG Compatible Lotions   Aveeno Moisturizing lotion  Cetaphil Moisturizing Cream  Cetaphil Moisturizing Lotion  Clairol Herbal Essence Moisturizing Lotion, Dry Skin  Clairol Herbal Essence Moisturizing Lotion, Extra Dry Skin  Clairol Herbal Essence Moisturizing Lotion, Normal  Skin  Curel Age Defying Therapeutic Moisturizing Lotion with Alpha Hydroxy  Curel Extreme Care Body Lotion  Curel Soothing Hands Moisturizing Hand Lotion  Curel Therapeutic Moisturizing Cream, Fragrance-Free  Curel Therapeutic Moisturizing Lotion, Fragrance-Free  Curel Therapeutic Moisturizing Lotion, Original Formula  Eucerin Daily Replenishing Lotion  Eucerin Dry Skin Therapy Plus Alpha Hydroxy Crme  Eucerin Dry Skin Therapy Plus Alpha Hydroxy Lotion  Eucerin Original Crme  Eucerin Original Lotion  Eucerin Plus Crme Eucerin Plus Lotion  Eucerin TriLipid Replenishing Lotion  Keri Anti-Bacterial Hand Lotion  Keri Deep Conditioning Original Lotion Dry Skin Formula Softly Scented  Keri Deep Conditioning Original Lotion, Fragrance Free Sensitive Skin Formula  Keri Lotion Fast Absorbing Fragrance Free Sensitive Skin Formula  Keri Lotion Fast Absorbing Softly Scented Dry Skin Formula  Keri Original Lotion  Keri Skin Renewal Lotion Ewing  Silky Smooth Lotion  Keri Silky Smooth Sensitive Skin Lotion  Nivea Body Creamy Conditioning Oil  Nivea Body Extra Enriched Teacher, adult education Moisturizing Lotion Nivea Crme  Nivea Skin Firming Lotion  NutraDerm 30 Skin Lotion  NutraDerm Skin Lotion  NutraDerm Therapeutic Skin Cream  NutraDerm Therapeutic Skin Lotion  ProShield Protective Hand Cream  Provon moisturizing lotion  Please read over the following fact sheets that you were given.

## 2022-08-11 ENCOUNTER — Encounter (HOSPITAL_COMMUNITY)
Admission: RE | Admit: 2022-08-11 | Discharge: 2022-08-11 | Disposition: A | Discharge status: Home / Self Care | Payer: BLUE CROSS/BLUE SHIELD | Source: Ambulatory Visit | Attending: Orthopedic Surgery | Admitting: Orthopedic Surgery

## 2022-08-11 ENCOUNTER — Encounter (HOSPITAL_COMMUNITY): Payer: Self-pay

## 2022-08-11 ENCOUNTER — Other Ambulatory Visit: Payer: Self-pay

## 2022-08-11 VITALS — BP 128/76 | HR 99 | Temp 98.7°F | Resp 16 | Ht 69.5 in | Wt 188.7 lb

## 2022-08-11 DIAGNOSIS — Z01812 Encounter for preprocedural laboratory examination: Secondary | ICD-10-CM | POA: Diagnosis present

## 2022-08-11 DIAGNOSIS — Z01818 Encounter for other preprocedural examination: Secondary | ICD-10-CM

## 2022-08-11 DIAGNOSIS — M5116 Intervertebral disc disorders with radiculopathy, lumbar region: Secondary | ICD-10-CM | POA: Insufficient documentation

## 2022-08-11 LAB — SURGICAL PCR SCREEN
MRSA, PCR: NEGATIVE
Staphylococcus aureus: NEGATIVE

## 2022-08-11 LAB — BASIC METABOLIC PANEL
Anion gap: 11 (ref 5–15)
BUN: 14 mg/dL (ref 6–20)
CO2: 26 mmol/L (ref 22–32)
Calcium: 9.7 mg/dL (ref 8.9–10.3)
Chloride: 103 mmol/L (ref 98–111)
Creatinine, Ser: 0.88 mg/dL (ref 0.44–1.00)
GFR, Estimated: >60 mL/min (ref >60)
Glucose, Bld: 95 mg/dL (ref 70–99)
Potassium: 4.1 mmol/L (ref 3.5–5.1)
Sodium: 140 mmol/L (ref 135–145)

## 2022-08-11 LAB — CBC
HCT: 43.8 % (ref 36.0–46.0)
Hemoglobin: 14.8 g/dL (ref 12.0–15.0)
MCH: 32.7 pg (ref 26.0–34.0)
MCHC: 33.8 g/dL (ref 30.0–36.0)
MCV: 96.7 fL (ref 80.0–100.0)
Platelets: 307 10*3/uL (ref 150–400)
RBC: 4.53 MIL/uL (ref 3.87–5.11)
RDW: 11.9 % (ref 11.5–15.5)
WBC: 9.1 10*3/uL (ref 4.0–10.5)
nRBC: 0 % (ref 0.0–0.2)

## 2022-08-11 NOTE — Progress Notes (Signed)
PCP - Edwin Dada FNP Cardiologist - Denies  PPM/ICD - Denies   Chest x-ray - NI  EKG - NI Stress Test - Denies ECHO - Denies Cardiac Cath - Denies  Sleep Study - Denies  DM - Denies  ERAS Protcol - Yes PRE-SURGERY Ensure given   COVID TEST- NI   Anesthesia review: No  Patient denies shortness of breath, fever, cough and chest pain at PAT appointment   All instructions explained to the patient, with a verbal understanding of the material. Patient agrees to go over the instructions while at home for a better understanding.  The opportunity to ask questions was provided.

## 2022-08-13 DIAGNOSIS — Z419 Encounter for procedure for purposes other than remedying health state, unspecified: Secondary | ICD-10-CM | POA: Diagnosis not present

## 2022-08-17 NOTE — Anesthesia Preprocedure Evaluation (Signed)
Anesthesia Evaluation   Patient awake    Reviewed: Allergy & Precautions, NPO status , Patient's Chart, lab work & pertinent test results  History of Anesthesia Complications Negative for: history of anesthetic complications  Airway Mallampati: II  TM Distance: >3 FB Neck ROM: Full    Dental no notable dental hx.    Pulmonary neg pulmonary ROS   Pulmonary exam normal        Cardiovascular negative cardio ROS Normal cardiovascular exam     Neuro/Psych  Headaches L5-S1 DISC HERNIATION, LUMBAR RADICULOPATHY    GI/Hepatic negative GI ROS,,,(+)     substance abuse  marijuana use  Endo/Other  negative endocrine ROS    Renal/GU negative Renal ROS  negative genitourinary   Musculoskeletal negative musculoskeletal ROS (+)    Abdominal   Peds  Hematology negative hematology ROS (+)   Anesthesia Other Findings Day of surgery medications reviewed with patient.  Reproductive/Obstetrics negative OB ROS                              Anesthesia Physical Anesthesia Plan  ASA: 2  Anesthesia Plan: General   Post-op Pain Management: Tylenol PO (pre-op)*, Precedex and Ketamine IV*   Induction: Intravenous  PONV Risk Score and Plan: 3 and Treatment may vary due to age or medical condition, Midazolam, Scopolamine patch - Pre-op, Dexamethasone and Ondansetron  Airway Management Planned: Oral ETT  Additional Equipment: None  Intra-op Plan:   Post-operative Plan: Extubation in OR  Informed Consent: I have reviewed the patients History and Physical, chart, labs and discussed the procedure including the risks, benefits and alternatives for the proposed anesthesia with the patient or authorized representative who has indicated his/her understanding and acceptance.       Plan Discussed with: CRNA  Anesthesia Plan Comments:         Anesthesia Quick Evaluation

## 2022-08-17 NOTE — H&P (Addendum)
Orthopedic Spine Surgery H&P Note  Assessment: Patient is a 27 y.o. female with L5/S1 disc herniation causing right sided radiculopathy   Plan: -Out of bed as tolerated, activity as tolerated, no brace -Covered the risks of surgery one more time with the patient and patient elected to proceed with planned surgery -Written consent verified -Hold anticoagulation in anticipation of surgery -Ancef and TXA on all to OR -NPO for procedure -Site marked -To OR when ready   The risks of microdiscectomy surgery were covered with the patient one more time this morning. Those risks included but were not limited to recurrent disc herniation, persistent pain, dural tear, nerve root injury, spinal cord injury, infection, bleeding, fracture, instability, need for additional procedures, dvt/pe, and death were discussed with the patient. The alternatives to surgical management were covered with the patient and included activity modification, physical therapy, over-the-counter pain medications, and injections.  All the patient's questions were answered to her satisfaction. After this discussion, the patient expressed understanding and elected to proceed with surgical intervention.    ___________________________________________________________________________  Chief Complaint: right leg pain  History: Patient is 27 y.o. female who has been previously seen in the office for right-sided radicular symptoms. Work up was consistent with a L5/S1 disc herniation causing right sided radiculopathy. Her symptoms failed to improve with conservative treatment so operative management was discussed at the last office visit. The patient presents today with with worsening pain particularly in the right leg and has developed some left buttock pain. Otherwise, there have been no changes since the last office visit. See previous office note for further details.    Review of systems: General: denies fevers and chills,  myalgias Neurologic: denies recent changes in vision, slurred speech Abdomen: denies nausea, vomiting, hematemesis Respiratory: denies cough, shortness of breath  Past medical history: Anxiety/depression Chronic pain   Allergies: NKDA   Past surgical history:  None   Social history: Denies use of nicotine product (smoking, vaping, patches, smokeless) Alcohol use: Rare Denies recreational drug use  Family history: -reviewed and not pertinent to disc herniation   Physical Exam:  General: no acute distress, appears stated age Neurologic: alert, answering questions appropriately, following commands Cardiovascular: regular rate, no cyanosis Respiratory: unlabored breathing on room air, symmetric chest rise Psychiatric: appropriate affect, normal cadence to speech   MSK (spine):  -Strength exam      Left  Right EHL    5/5  5/5 TA    5/5  5/5 GSC    5/5  5/5 Knee extension  5/5  5/5 Knee flexion   5/5  5/5 Hip flexion   5/5  5/5  -Sensory exam    Sensation intact to light touch in L3-S1 nerve distributions of bilateral lower extremities   Patient name: Rebecca Fox Patient MRN: [id number] Date: 08/17/22

## 2022-08-18 ENCOUNTER — Encounter (HOSPITAL_COMMUNITY)
Admission: RE | Disposition: A | Discharge status: Home / Self Care | Payer: Self-pay | Source: Home / Self Care | Attending: Orthopedic Surgery

## 2022-08-18 ENCOUNTER — Ambulatory Visit (HOSPITAL_COMMUNITY): Payer: BLUE CROSS/BLUE SHIELD | Admitting: Anesthesiology

## 2022-08-18 ENCOUNTER — Encounter (HOSPITAL_COMMUNITY): Payer: Self-pay | Admitting: Orthopedic Surgery

## 2022-08-18 ENCOUNTER — Ambulatory Visit (HOSPITAL_COMMUNITY)
Admission: RE | Admit: 2022-08-18 | Discharge: 2022-08-18 | Disposition: A | Discharge status: Home / Self Care | Payer: BLUE CROSS/BLUE SHIELD | Attending: Orthopedic Surgery | Admitting: Orthopedic Surgery

## 2022-08-18 ENCOUNTER — Ambulatory Visit (HOSPITAL_COMMUNITY): Payer: BLUE CROSS/BLUE SHIELD

## 2022-08-18 ENCOUNTER — Other Ambulatory Visit: Payer: Self-pay

## 2022-08-18 DIAGNOSIS — M5116 Intervertebral disc disorders with radiculopathy, lumbar region: Secondary | ICD-10-CM | POA: Diagnosis not present

## 2022-08-18 DIAGNOSIS — M5117 Intervertebral disc disorders with radiculopathy, lumbosacral region: Secondary | ICD-10-CM | POA: Insufficient documentation

## 2022-08-18 HISTORY — PX: LUMBAR LAMINECTOMY: SHX95

## 2022-08-18 LAB — POCT PREGNANCY, URINE: Preg Test, Ur: NEGATIVE

## 2022-08-18 SURGERY — MICRODISCECTOMY LUMBAR LAMINECTOMY
Anesthesia: General | Site: Spine Lumbar

## 2022-08-18 MED ORDER — LACTATED RINGERS IV SOLN: INTRAVENOUS | Status: DC

## 2022-08-18 MED ORDER — ONDANSETRON HCL 4 MG/2ML IJ SOLN
INTRAMUSCULAR | Status: DC | PRN
  Administered 2022-08-18: 4 mg via INTRAVENOUS

## 2022-08-18 MED ORDER — PROPOFOL 10 MG/ML IV BOLUS
INTRAVENOUS | Status: AC
  Filled 2022-08-18: qty 20

## 2022-08-18 MED ORDER — 0.9 % SODIUM CHLORIDE (POUR BTL) OPTIME
TOPICAL | Status: DC | PRN
  Administered 2022-08-18: 1000 mL

## 2022-08-18 MED ORDER — ACETAMINOPHEN 500 MG PO TABS: 1000.0000 mg | ORAL_TABLET | Freq: Three times a day (TID) | ORAL | 0 refills | Status: AC

## 2022-08-18 MED ORDER — ROCURONIUM BROMIDE 10 MG/ML (PF) SYRINGE
PREFILLED_SYRINGE | INTRAVENOUS | Status: DC | PRN
  Administered 2022-08-18: 10 mg via INTRAVENOUS
  Administered 2022-08-18: 70 mg via INTRAVENOUS
  Administered 2022-08-18 (×2): 10 mg via INTRAVENOUS

## 2022-08-18 MED ORDER — KETAMINE HCL 10 MG/ML IJ SOLN
INTRAMUSCULAR | Status: DC | PRN
Start: 2022-08-18 — End: 2022-08-18
  Administered 2022-08-18: 20 mg via INTRAVENOUS
  Administered 2022-08-18: 30 mg via INTRAVENOUS

## 2022-08-18 MED ORDER — FENTANYL CITRATE (PF) 250 MCG/5ML IJ SOLN
INTRAMUSCULAR | Status: DC | PRN
  Administered 2022-08-18: 50 ug via INTRAVENOUS
  Administered 2022-08-18: 100 ug via INTRAVENOUS
  Administered 2022-08-18 (×4): 25 ug via INTRAVENOUS

## 2022-08-18 MED ORDER — LIDOCAINE 2% (20 MG/ML) 5 ML SYRINGE
INTRAMUSCULAR | Status: DC | PRN
  Administered 2022-08-18: 100 mg via INTRAVENOUS

## 2022-08-18 MED ORDER — METHYLPREDNISOLONE ACETATE 40 MG/ML IJ SUSP
INTRAMUSCULAR | Status: AC
  Filled 2022-08-18: qty 1

## 2022-08-18 MED ORDER — ONDANSETRON HCL 4 MG/2ML IJ SOLN
INTRAMUSCULAR | Status: AC
  Filled 2022-08-18: qty 2

## 2022-08-18 MED ORDER — HYDROMORPHONE HCL 1 MG/ML IJ SOLN
0.2500 mg | INTRAMUSCULAR | Status: DC | PRN
  Administered 2022-08-18 (×2): 0.5 mg via INTRAVENOUS

## 2022-08-18 MED ORDER — THROMBIN 20000 UNITS EX SOLR
CUTANEOUS | Status: AC
  Filled 2022-08-18: qty 20000

## 2022-08-18 MED ORDER — DEXAMETHASONE SODIUM PHOSPHATE 10 MG/ML IJ SOLN
INTRAMUSCULAR | Status: AC
  Filled 2022-08-18: qty 1

## 2022-08-18 MED ORDER — MIDAZOLAM HCL 2 MG/2ML IJ SOLN
INTRAMUSCULAR | Status: AC
  Filled 2022-08-18: qty 2

## 2022-08-18 MED ORDER — CHLORHEXIDINE GLUCONATE 0.12 % MT SOLN
15.0000 mL | OROMUCOSAL | Status: AC
  Administered 2022-08-18: 15 mL via OROMUCOSAL
  Filled 2022-08-18: qty 15

## 2022-08-18 MED ORDER — PROPOFOL 10 MG/ML IV BOLUS
INTRAVENOUS | Status: DC | PRN
Start: 2022-08-18 — End: 2022-08-18
  Administered 2022-08-18: 170 mg via INTRAVENOUS

## 2022-08-18 MED ORDER — POLYETHYLENE GLYCOL 3350 17 G PO PACK: 17.0000 g | PACK | Freq: Every day | ORAL | 0 refills | Status: AC

## 2022-08-18 MED ORDER — HYDROMORPHONE HCL 1 MG/ML IJ SOLN
INTRAMUSCULAR | Status: AC
  Filled 2022-08-18: qty 1

## 2022-08-18 MED ORDER — METHYLPREDNISOLONE ACETATE 40 MG/ML IJ SUSP
INTRAMUSCULAR | Status: DC | PRN
  Administered 2022-08-18: 40 mg

## 2022-08-18 MED ORDER — KETAMINE HCL 50 MG/5ML IJ SOSY
PREFILLED_SYRINGE | INTRAMUSCULAR | Status: AC
  Filled 2022-08-18: qty 5

## 2022-08-18 MED ORDER — ROCURONIUM BROMIDE 10 MG/ML (PF) SYRINGE
PREFILLED_SYRINGE | INTRAVENOUS | Status: AC
  Filled 2022-08-18: qty 10

## 2022-08-18 MED ORDER — SUGAMMADEX SODIUM 200 MG/2ML IV SOLN
INTRAVENOUS | Status: DC | PRN
  Administered 2022-08-18: 170 mg via INTRAVENOUS

## 2022-08-18 MED ORDER — METHOCARBAMOL 500 MG PO TABS: 500.0000 mg | ORAL_TABLET | Freq: Four times a day (QID) | ORAL | 0 refills | Status: AC

## 2022-08-18 MED ORDER — AMISULPRIDE (ANTIEMETIC) 5 MG/2ML IV SOLN
10.0000 mg | Freq: Once | INTRAVENOUS | Status: AC | PRN
  Administered 2022-08-18: 10 mg via INTRAVENOUS

## 2022-08-18 MED ORDER — POVIDONE-IODINE 10 % EX SWAB: 2.0000 | Freq: Once | CUTANEOUS | Status: DC

## 2022-08-18 MED ORDER — BUPIVACAINE-EPINEPHRINE (PF) 0.25% -1:200000 IJ SOLN
INTRAMUSCULAR | Status: AC
  Filled 2022-08-18: qty 30

## 2022-08-18 MED ORDER — MIDAZOLAM HCL 2 MG/2ML IJ SOLN
INTRAMUSCULAR | Status: DC | PRN
  Administered 2022-08-18: 2 mg via INTRAVENOUS

## 2022-08-18 MED ORDER — ONDANSETRON HCL 4 MG PO TABS: 4.0000 mg | ORAL_TABLET | Freq: Three times a day (TID) | ORAL | 0 refills | Status: AC | PRN

## 2022-08-18 MED ORDER — THROMBIN 20000 UNITS EX SOLR: CUTANEOUS | Status: DC | PRN

## 2022-08-18 MED ORDER — SENNA 8.6 MG PO TABS
1.0000 | ORAL_TABLET | Freq: Two times a day (BID) | ORAL | 0 refills | Status: AC
Start: 2022-08-18 — End: 2022-09-01

## 2022-08-18 MED ORDER — CEFAZOLIN SODIUM-DEXTROSE 2-4 GM/100ML-% IV SOLN
2.0000 g | INTRAVENOUS | Status: AC
  Administered 2022-08-18: 2 g via INTRAVENOUS
  Filled 2022-08-18: qty 100

## 2022-08-18 MED ORDER — ACETAMINOPHEN 500 MG PO TABS
1000.0000 mg | ORAL_TABLET | Freq: Once | ORAL | Status: AC
  Administered 2022-08-18: 1000 mg via ORAL
  Filled 2022-08-18: qty 2

## 2022-08-18 MED ORDER — OXYCODONE HCL 5 MG PO TABS
5.0000 mg | ORAL_TABLET | ORAL | 0 refills | Status: AC | PRN
Start: 2022-08-18 — End: 2022-08-25

## 2022-08-18 MED ORDER — LIDOCAINE 2% (20 MG/ML) 5 ML SYRINGE
INTRAMUSCULAR | Status: AC
  Filled 2022-08-18: qty 5

## 2022-08-18 MED ORDER — SURGIFLO WITH THROMBIN (HEMOSTATIC MATRIX KIT) OPTIME
TOPICAL | Status: DC | PRN
Start: 2022-08-18 — End: 2022-08-18
  Administered 2022-08-18: 1 via TOPICAL

## 2022-08-18 MED ORDER — SCOPOLAMINE 1 MG/3DAYS TD PT72
1.0000 | MEDICATED_PATCH | Freq: Once | TRANSDERMAL | Status: DC
  Administered 2022-08-18: 1.5 mg via TRANSDERMAL
  Filled 2022-08-18: qty 1

## 2022-08-18 MED ORDER — TRANEXAMIC ACID-NACL 1000-0.7 MG/100ML-% IV SOLN
1000.0000 mg | INTRAVENOUS | Status: AC
  Administered 2022-08-18: 1000 mg via INTRAVENOUS
  Filled 2022-08-18: qty 100

## 2022-08-18 MED ORDER — FENTANYL CITRATE (PF) 250 MCG/5ML IJ SOLN
INTRAMUSCULAR | Status: AC
  Filled 2022-08-18: qty 5

## 2022-08-18 MED ORDER — DEXAMETHASONE SODIUM PHOSPHATE 10 MG/ML IJ SOLN
10.0000 mg | Freq: Once | INTRAMUSCULAR | Status: AC
  Administered 2022-08-18: 10 mg via INTRAVENOUS

## 2022-08-18 MED ORDER — BUPIVACAINE-EPINEPHRINE 0.25% -1:200000 IJ SOLN
INTRAMUSCULAR | Status: DC | PRN
  Administered 2022-08-18: 30 mL

## 2022-08-18 MED ORDER — AMISULPRIDE (ANTIEMETIC) 5 MG/2ML IV SOLN
INTRAVENOUS | Status: AC
  Filled 2022-08-18: qty 4

## 2022-08-18 SURGICAL SUPPLY — 52 items
AGENT HMST KT MTR STRL THRMB (HEMOSTASIS) ×1
APL SKNCLS STERI-STRIP NONHPOA (GAUZE/BANDAGES/DRESSINGS) ×1
BENZOIN TINCTURE PRP APPL 2/3 (GAUZE/BANDAGES/DRESSINGS) IMPLANT
BUR NEURO DRILL SOFT 3.0X3.8M (BURR) ×2 IMPLANT
CANISTER SUCT 3000ML PPV (MISCELLANEOUS) ×2 IMPLANT
CORD BIPOLAR FORCEPS 12FT (ELECTRODE) ×2 IMPLANT
COVER MAYO STAND STRL (DRAPES) ×2 IMPLANT
COVER SURGICAL LIGHT HANDLE (MISCELLANEOUS) ×2 IMPLANT
DRAPE C-ARM 42X72 X-RAY (DRAPES) ×2 IMPLANT
DRAPE MICROSCOPE LEICA 54X105 (DRAPES) ×2 IMPLANT
DRAPE UTILITY XL STRL (DRAPES) ×2 IMPLANT
DRESSING MEPILEX FLEX 4X4 (GAUZE/BANDAGES/DRESSINGS) ×2 IMPLANT
DRSG MEPILEX FLEX 4X4 (GAUZE/BANDAGES/DRESSINGS) ×1
DRSG TEGADERM 4X10 (GAUZE/BANDAGES/DRESSINGS) ×2 IMPLANT
DURAPREP 26ML APPLICATOR (WOUND CARE) ×2 IMPLANT
ELECT BLADE 4.0 EZ CLEAN MEGAD (MISCELLANEOUS) ×1
ELECT BLADE INSULATED 4IN (ELECTROSURGICAL) ×1
ELECT PENCIL ROCKER SW 15FT (MISCELLANEOUS) ×2 IMPLANT
ELECT REM PT RETURN 9FT ADLT (ELECTROSURGICAL) ×1
ELECTRODE BLADE INSULATED 4IN (ELECTROSURGICAL) ×2 IMPLANT
ELECTRODE BLDE 4.0 EZ CLN MEGD (MISCELLANEOUS) ×2 IMPLANT
ELECTRODE REM PT RTRN 9FT ADLT (ELECTROSURGICAL) ×2 IMPLANT
GLOVE BIO SURGEON STRL SZ7.5 (GLOVE) ×2 IMPLANT
GLOVE INDICATOR 7.5 STRL GRN (GLOVE) ×2 IMPLANT
GOWN STRL REUS W/ TWL LRG LVL3 (GOWN DISPOSABLE) ×2 IMPLANT
GOWN STRL REUS W/TWL LRG LVL3 (GOWN DISPOSABLE) ×1
GOWN STRL SURGICAL XL XLNG (GOWN DISPOSABLE) ×2 IMPLANT
KIT BASIN OR (CUSTOM PROCEDURE TRAY) ×2 IMPLANT
KIT POSITION SURG JACKSON T1 (MISCELLANEOUS) ×2 IMPLANT
KIT TURNOVER KIT B (KITS) ×2 IMPLANT
NDL 22X1.5 STRL (OR ONLY) (MISCELLANEOUS) ×2 IMPLANT
NEEDLE 22X1.5 STRL (OR ONLY) (MISCELLANEOUS) ×1
NS IRRIG 1000ML POUR BTL (IV SOLUTION) ×2 IMPLANT
PACK LAMINECTOMY ORTHO (CUSTOM PROCEDURE TRAY) ×2 IMPLANT
PATTIES SURGICAL .5 X.5 (GAUZE/BANDAGES/DRESSINGS) ×2 IMPLANT
SPONGE SURGIFOAM ABS GEL 100 (HEMOSTASIS) ×2 IMPLANT
SPONGE T-LAP 4X18 ~~LOC~~+RFID (SPONGE) ×2 IMPLANT
STRIP CLOSURE SKIN 1/2X4 (GAUZE/BANDAGES/DRESSINGS) IMPLANT
SUCTION TUBE FRAZIER 10FR DISP (SUCTIONS) ×2 IMPLANT
SURGIFLO W/THROMBIN 8M KIT (HEMOSTASIS) IMPLANT
SUT BONE WAX W31G (SUTURE) ×2 IMPLANT
SUT MNCRL AB 3-0 PS2 18 (SUTURE) ×2 IMPLANT
SUT VIC AB 0 CT1 18XCR BRD8 (SUTURE) ×2 IMPLANT
SUT VIC AB 0 CT1 8-18 (SUTURE) ×1
SUT VIC AB 2-0 CT1 18 (SUTURE) ×2 IMPLANT
SUT VIC AB 2-0 CT2 18 VCP726D (SUTURE) IMPLANT
SYR BULB IRRIG 60ML STRL (SYRINGE) ×2 IMPLANT
SYR CONTROL 10ML LL (SYRINGE) ×2 IMPLANT
TOWEL GREEN STERILE (TOWEL DISPOSABLE) ×2 IMPLANT
TOWEL GREEN STERILE FF (TOWEL DISPOSABLE) ×2 IMPLANT
TUBING FEATHERFLOW (TUBING) ×2 IMPLANT
WATER STERILE IRR 1000ML POUR (IV SOLUTION) ×2 IMPLANT

## 2022-08-18 NOTE — Discharge Instructions (Signed)
Orthopedic Surgery Discharge Instructions  Patient name: Rebecca Fox Procedure Performed: L5/S1 microdiscectomy Date of Surgery: 08/18/2022 Surgeon: Willia Craze, MD  Pre-operative Diagnosis: L5/S1 herniated disc Post-operative Diagnosis: L5/S1 herniated disc  Discharge Date: 08/18/2022 Discharged to: home Discharge Condition: stable  Activity: You should not bend/lift/twist greater than 10 pounds for a total of 6 weeks after surgery. You do not need to wear a brace. You are encouraged to walk as much as desired. You can perform household activities such as cleaning dishes, doing laundry, vacuuming, etc. You should let pain be your guide and gradually return to full activities after the six weeks.   Incision Care: Your incision site has a dressing over it. That dressing should remain in place and dry at all times for a total of one week after surgery. After one week, you can remove the dressing. Underneath the dressing, you will find pieces of tape. You should leave these pieces of tape in place. They will fall off with time. Do not pick, rub, or scrub at them. Do not put cream or lotion over the surgical area. After one week and once the dressing is off, it is okay to let soap and water run over your incision. Again, do not pick, scrub, or rub at the pieces of tape when bathing. Do not submerge (e.g., take a bath, swim, go in a hot tub, etc.) until twelve weeks after surgery. There may be some bloody drainage from the incision into the dressing after surgery. This is normal. You do not need to replace the dressing. Continue to leave it in place for the one week as instructed above. Should the dressing become saturated with blood or drainage, please call the office for further instructions.   Medications: You have been prescribed oxycodone. This is a narcotic pain medication and should only be taken as prescribed. You should not drink alcohol or operate heavy machinery (including driving) while  taking this medication. The oxycodone can cause constipation as a side effect. For that reason, you have been prescribed senna and miralax. These are both laxatives. You do not need to take this medication if you develop diarrhea. Should you remain constipated even while taking these medications, please increase the dose of miralax to twice daily. Tylenol has been prescribed to be taken every 8 hours, which will give you additional pain relief. Robaxin is a muscle relaxer that has been prescribed to you for muscle spasm type pain. Take this medication as needed. Zofran is an antinausea medication that helps with nausea and vomiting. You can take this medication as needed for nausea or vomiting.   You can use over-the-counter NSAIDs (ibuprofen, Aleve, Celebrex, naproxen, meloxicam, etc.) for additional pain relief after this surgery. These medications are safe to take with the Tylenol you have been prescribed. You should not take these medications if you have or have had kidney problems or gastrointestinal ulcers. Take these medications as instructed on the packaging.   In order to set expectations for opioid prescriptions, you will only be prescribed opioids for a total of six weeks after surgery and, at two-weeks after surgery, your opioid prescription will start to tapered (decreased dosage and number of pills). If you have ongoing need for opioid medication six weeks after surgery, you will be referred to pain management. If you are already established with a provider that is giving you opioid medications, you should schedule an appointment with them for six weeks after surgery if you feel you are going to need another  prescription. State law only allows for opioid prescriptions one week at a time. If you are running out of opioid medication near the end of the week, please call the office during business hours before running out so I can send you another prescription.   You may resume any home blood  thinners (warfarin, lovenox, apixaban, plavix, xarelto, etc) 72 hours after your surgery. Take these medications as they were previously prescribed.  Driving: You should not drive while taking narcotic pain medications. You should start getting back to driving slowly and you may want to try driving in a parking lot before doing anything more.   Diet: You are safe to resume your regular diet after surgery.   Reasons to Call the Office After Surgery: You should feel free to call the office with any concerns or questions you have in the post-operative period, but you should definitely notify the office if you develop: -shortness of breath, chest pain, or trouble breathing -excessive bleeding, drainage, redness, or swelling around the surgical site -fevers, chills, or pain that is getting worse with each passing day -persistent nausea or vomiting -new weakness in either of your legs, new and worsening numbness/tingling in either leg -numbness in the groin, bowel or bladder incontinence -other concerns about your surgery  Follow Up Appointments: You should have an office appointment scheduled for approximately two weeks after surgery. If you do not remember when this appointment is or do not already have it scheduled, please call the office to schedule.   Office Information:  -Willia Craze, MD -Phone number: [phone] -Address: [address]       [place], [place] [address]

## 2022-08-18 NOTE — Anesthesia Procedure Notes (Signed)
Procedure Name: Intubation Date/Time: 08/18/2022 7:41 AM  Performed by: Shary Decamp, CRNAPre-anesthesia Checklist: Patient identified, Patient being monitored, Timeout performed, Emergency Drugs available and Suction available Patient Re-evaluated:Patient Re-evaluated prior to induction Oxygen Delivery Method: Circle System Utilized Preoxygenation: Pre-oxygenation with 100% oxygen Induction Type: IV induction Ventilation: Mask ventilation without difficulty Laryngoscope Size: Miller and 2 Grade View: Grade I Tube type: Oral Tube size: 7.0 mm Number of attempts: 1 Airway Equipment and Method: Stylet Placement Confirmation: ETT inserted through vocal cords under direct vision, positive ETCO2 and breath sounds checked- equal and bilateral Secured at: 22 cm Tube secured with: Tape Dental Injury: Teeth and Oropharynx as per pre-operative assessment

## 2022-08-18 NOTE — Progress Notes (Signed)
Orthopedic Surgery Post-operative Progress Note  Assessment: Patient is a 27 y.o. female who is in the PACU recovering after L5/S1 microdiscectomy   Plan: -Operative plans complete -Drain: none -Out of bed as tolerated, no brace -No bending/lifting/twisting greater than 10 pounds -Pain control -Regular diet -No chemoprophylaxis for dvt or antiplatelets for 72 hours after surgery -Disposition: to home from PACU  ___________________________________________________________________________   Subjective: No acute events since surgery. Having back pain that is currently well controlled. No radiating leg pain.   Objective:  General: no acute distress, appropriate affect Neurologic: alert, answering questions appropriately, following commands Respiratory: unlabored breathing on room air Skin: dressing clear/dry/intact  MSK (spine):  -Strength exam      Right  Left  EHL    5/5  5/5 TA    5/5  5/5 GSC    5/5  5/5 Knee extension  5/5  5/5 Hip flexion   5/5  5/5  -Sensory exam    Sensation intact to light touch in L3-S1 nerve distributions of bilateral lower extremities   Patient name: Rebecca Fox Patient MRN: [id number] Date: 08/18/22

## 2022-08-18 NOTE — Anesthesia Postprocedure Evaluation (Signed)
Anesthesia Post Note  Patient: Rebecca Fox  Procedure(s) Performed: L5-S1 MICRODISCECTOMY (Spine Lumbar)     Patient location during evaluation: PACU Anesthesia Type: General Level of consciousness: awake and alert Pain management: pain level controlled Vital Signs Assessment: post-procedure vital signs reviewed and stable Respiratory status: spontaneous breathing, nonlabored ventilation and respiratory function stable Cardiovascular status: blood pressure returned to baseline Postop Assessment: no apparent nausea or vomiting Anesthetic complications: no   No notable events documented.  Last Vitals:  Vitals:   08/18/22 1215 08/18/22 1230  BP: 120/70 109/69  Pulse: 84 78  Resp: (!) 25 (!) 24  Temp:  36.4 C  SpO2: 97% 97%    Last Pain:  Vitals:   08/18/22 1200  TempSrc:   PainSc: 2                  Shanda Howells

## 2022-08-18 NOTE — Op Note (Addendum)
Orthopedic Spine Surgery Operative Report  Procedure: L5/S1 microdiscectomy Use of intra-operative microscope  Modifier: none  Date of procedure: 08/18/2022  Patient name: Rebecca Fox MRN: [id number] DOB: April 28, 1995  Surgeon: Willia Craze, MD Assistant: none Pre-operative diagnosis: L5/S1 disc herniation, lumbar radiculopathy Post-operative diagnosis: same as above Findings: L5/S1 herniated disc with loose fragment  Specimens: none Anesthesia: general EBL: 30cc Complications: none Pre-incision antibiotic: ancef TXA given prior to incision as well  Implants: none   Indication for procedure: Patient is a 27 y.o. female who presented to the office with signs and symptoms consistent with lumbar radiculopathy. MRI revealed a L5/S1 disc herniation. The patient had tried conservative treatments that did not provide any lasting relief. As result, operative management was discussed. A L5/S1 right-sided microdiscectomy was presented as a treatment option. The risks of the surgery including but not limited to recurrent disc herniation, persistent pain, dural tear, nerve root injury, spinal cord injury, infection, bleeding, fracture, instability, need for additional procedures, blindness, heart attack, stroke, and death were discussed with the patient. The benefits of the surgery would be faster relief of the patients leg pain. Explained that this symptomatic relief is for leg pain and the surgery will not necessarily help any back pain. The alternatives to surgical management were covered with the patient and included activity modification, physical therapy, over-the-counter pain medications, and injections.  All the patient's questions were answered to her satisfaction. After this discussion, the patient expressed understanding and elected to proceed with surgical intervention.  Procedure Description: The patient was met in the pre-operative holding area. The patient's identity and consent  were verified. The operative site was marked. The patient's remaining questions about the surgery were answered. The patient was brought back to the operating room. General anesthesia was induced and an endotracheal tube was placed by the anesthesia staff. The patient was transferred to the prone [place] table in the prone position. All bony prominences were well padded. The head of the bed was slightly elevated and the eyes were free from compression by the face pillow. The surgical area was cleansed with alcohol. Fluoroscopy was then brought in to check rotation on the AP image and to mark the levels on the lateral image. The patient's skin was then prepped and draped in a standard, sterile fashion. A time out was performed that identified the patient, the procedure, and the operative level. All team members agreed with what was stated in the time out.   A midline incision over the spinous processes of the previously marked levels was made and sharp dissection was continued down through the skin and dermis. Electrocautery was then used to continue the midline dissection down to the level of the spinous process. Subperiosteal dissection was performed using electrocautery to expose the lamina out lateral to the facet joint capsule on the side of the disc herniation (right side). Care was taken to not violate the facet joint capsule. A lateral fluoroscopic image was taken to confirm the level. Subperiosteal dissection with electrocautery was then done to expose all the remainder of the lamina and pars interarticularis of L5. The top of the S1 lamina was exposed as well. The operative microscope was brought in at this time.   A high-speed matchstick burr was used to thin the hemilamina to the level of the ligamentum flavum. The lamina was thinned with the burr to the edge of the ligamentum flavum insertion. Care was taken to leave at least 8mm of pars interarticularis. A curved curette was used to  develop a plane  between the ligamentum and the lamina. A series of Kerrison rongeurs were used to remove the thinned lamina to complete the laminotomy. A curved curette was used to elevate the ligamentum flavum off of the thecal sac. A combination of Kerrison rongeurs and a pituitary were used to remove the ligamentum flavum overlying the thecal sac and nerve root in the area of the laminotomy.   A penfield was used to mobilize the nerve root. A nerve root retractor was placed into the laminotomy site and around the traversing nerve root to mobilize it medially. The disc herniation was visualized. A long-handle knife was used to create an annulotomy. A pituitary and upbiting pituitary were used to remove the herniated disc fragment. A pituitary was used to remove the loose fragments. There was one large loose fragment and several smaller loose fragments. A nerve hook was placed into the annulotomy to attempt to free any other loose fragments. There were no other loose fragments. A woodsen was used to palpate the disc space and there was no remaining herniation. The disc was in line with the posterior vertebral bodies of L5 and S1.  The wound was copiously irrigated with sterile saline. 30cc of marcaine with epinephrine was injected in the muscle and soft tissue within the wound. The fascia was reapproximated with 0 vicryl suture. The subcutaneous fat was reapproximated with 0 vicryl suture. The deep dermal layer was reapproximated with 2-0 viryl. The skin as closed with a 3-0 running moncryl. All counts were correct at the end of the case. The incision was dressed with steri strips and benzoine. An island dressing was placed over the wound. The patient was transferred back to a bed and brought to the post-anesthesia care unit by anesthesia staff in stable condition.   Post-operative plan: The patient will recover in the post-anesthesia care unit with a plan to go home after recovering from anesthesia. The patient will be out  of bed as tolerated with no brace. The patient will be seen in the office approximately 2 weeks from the date of surgery.   Willia Craze, MD Orthopedic Surgeon

## 2022-08-18 NOTE — Discharge Summary (Signed)
Orthopedic Surgery Discharge Summary  Patient name: Rebecca Fox Patient MRN: [id number] Surgery date: 08/18/2022 Discharge date: 08/18/2022  Attending physician: Willia Craze, MD Final diagnosis: L5/S1 disc herniation Findings: L5/S1 disc herniation  Hospital course: Patient is a 27 y.o. female who was scheduled to have outpatient L5/S1 microdiscectomy on the right side. The patient had significant pain immediately after surgery, but pain eventually was controlled in the PACU. The patient was tolerating PO without issue and voided spontaneously after surgery. The patient's vitals were stable at discharge. The patient was medically ready for discharge and was discharge to home as planned with no hospital admission.   Instructions:   Orthopedic Surgery Discharge Instructions   Patient name: Rebecca Fox Procedure Performed: L5/S1 microdiscectomy Date of Surgery: 08/18/2022 Surgeon: Willia Craze, MD   Pre-operative Diagnosis: L5/S1 herniated disc Post-operative Diagnosis: L5/S1 herniated disc   Discharge Date: 08/18/2022 Discharged to: home Discharge Condition: stable   Activity: You should not bend/lift/twist greater than 10 pounds for a total of 6 weeks after surgery. You do not need to wear a brace. You are encouraged to walk as much as desired. You can perform household activities such as cleaning dishes, doing laundry, vacuuming, etc. You should let pain be your guide and gradually return to full activities after the six weeks.    Incision Care: Your incision site has a dressing over it. That dressing should remain in place and dry at all times for a total of one week after surgery. After one week, you can remove the dressing. Underneath the dressing, you will find pieces of tape. You should leave these pieces of tape in place. They will fall off with time. Do not pick, rub, or scrub at them. Do not put cream or lotion over the surgical area. After one week and once the dressing  is off, it is okay to let soap and water run over your incision. Again, do not pick, scrub, or rub at the pieces of tape when bathing. Do not submerge (e.g., take a bath, swim, go in a hot tub, etc.) until twelve weeks after surgery. There may be some bloody drainage from the incision into the dressing after surgery. This is normal. You do not need to replace the dressing. Continue to leave it in place for the one week as instructed above. Should the dressing become saturated with blood or drainage, please call the office for further instructions.    Medications: You have been prescribed oxycodone. This is a narcotic pain medication and should only be taken as prescribed. You should not drink alcohol or operate heavy machinery (including driving) while taking this medication. The oxycodone can cause constipation as a side effect. For that reason, you have been prescribed senna and miralax. These are both laxatives. You do not need to take this medication if you develop diarrhea. Should you remain constipated even while taking these medications, please increase the dose of miralax to twice daily. Tylenol has been prescribed to be taken every 8 hours, which will give you additional pain relief. Robaxin is a muscle relaxer that has been prescribed to you for muscle spasm type pain. Take this medication as needed. Zofran is an antinausea medication that helps with nausea and vomiting. You can take this medication as needed for nausea or vomiting.    You can use over-the-counter NSAIDs (ibuprofen, Aleve, Celebrex, naproxen, meloxicam, etc.) for additional pain relief after this surgery. These medications are safe to take with the Tylenol you have been prescribed. You should  not take these medications if you have or have had kidney problems or gastrointestinal ulcers. Take these medications as instructed on the packaging.    In order to set expectations for opioid prescriptions, you will only be prescribed opioids  for a total of six weeks after surgery and, at two-weeks after surgery, your opioid prescription will start to tapered (decreased dosage and number of pills). If you have ongoing need for opioid medication six weeks after surgery, you will be referred to pain management. If you are already established with a provider that is giving you opioid medications, you should schedule an appointment with them for six weeks after surgery if you feel you are going to need another prescription. State law only allows for opioid prescriptions one week at a time. If you are running out of opioid medication near the end of the week, please call the office during business hours before running out so I can send you another prescription.    You may resume any home blood thinners (warfarin, lovenox, apixaban, plavix, xarelto, etc) 72 hours after your surgery. Take these medications as they were previously prescribed.   Driving: You should not drive while taking narcotic pain medications. You should start getting back to driving slowly and you may want to try driving in a parking lot before doing anything more.    Diet: You are safe to resume your regular diet after surgery.    Reasons to Call the Office After Surgery: You should feel free to call the office with any concerns or questions you have in the post-operative period, but you should definitely notify the office if you develop: -shortness of breath, chest pain, or trouble breathing -excessive bleeding, drainage, redness, or swelling around the surgical site -fevers, chills, or pain that is getting worse with each passing day -persistent nausea or vomiting -new weakness in either of your legs, new and worsening numbness/tingling in either leg -numbness in the groin, bowel or bladder incontinence -other concerns about your surgery   Follow Up Appointments: You should have an office appointment scheduled for approximately two weeks after surgery. If you do not  remember when this appointment is or do not already have it scheduled, please call the office to schedule.    Office Information:  -Willia Craze, MD -Phone number: [phone] -Address: [address]                  [place], [place] [address]

## 2022-08-18 NOTE — Transfer of Care (Signed)
Immediate Anesthesia Transfer of Care Note  Patient: Rebecca Fox  Procedure(s) Performed: L5-S1 MICRODISCECTOMY (Spine Lumbar)  Patient Location: PACU  Anesthesia Type:General  Level of Consciousness: awake, alert , and oriented  Airway & Oxygen Therapy: Patient Spontanous Breathing and Patient connected to nasal cannula oxygen  Post-op Assessment: Report given to RN and Post -op Vital signs reviewed and stable  Post vital signs: Reviewed and stable  Last Vitals:  Vitals Value Taken Time  BP 113/47 08/18/22 1130  Temp    Pulse 82 08/18/22 1131  Resp 20 08/18/22 1131  SpO2 98 % 08/18/22 1131  Vitals shown include unfiled device data.  Last Pain:  Vitals:   08/18/22 0636  TempSrc:   PainSc: 6       Patients Stated Pain Goal: 0 (08/18/22 0636)  Complications: No notable events documented.

## 2022-08-19 ENCOUNTER — Encounter (HOSPITAL_COMMUNITY): Payer: Self-pay | Admitting: Orthopedic Surgery

## 2022-08-28 ENCOUNTER — Ambulatory Visit: Payer: BLUE CROSS/BLUE SHIELD | Admitting: Orthopedic Surgery

## 2022-08-28 ENCOUNTER — Telehealth: Payer: Self-pay | Admitting: Orthopedic Surgery

## 2022-08-28 ENCOUNTER — Encounter: Payer: Self-pay | Admitting: Radiology

## 2022-08-28 DIAGNOSIS — Z9889 Other specified postprocedural states: Secondary | ICD-10-CM

## 2022-08-28 NOTE — Progress Notes (Signed)
Orthopedic Surgery Post-operative Office Visit  Procedure: L5/S1 microdiscectomy Date of Surgery: 08/18/2022 (~2 weeks post-op)  Assessment: Patient is a 27 y.o. who is doing well after microdiscectomy   Plan: -Operative plans complete -Out of bed as tolerated, no brace -No bending/lifting/twisting greater than 10 pounds for the next 4 weeks, then no restrictions -Pain management: OTC medications -Return to office in 4 weeks, lumbar x-rays needed at next visit: None  ___________________________________________________________________________   Subjective: Patient has been doing well since surgery.  Her back pain is gradually gotten better with time.  She is no longer taking any medications for pain.  She is ambulating without any assistive devices.  Her radiating leg pain has resolved since surgery.  She has not noticed any redness or drainage around her incision.  Objective:  General: no acute distress, appropriate affect Neurologic: alert, answering questions appropriately, following commands Respiratory: unlabored breathing on room air Skin: incision is well-approximated with no erythema, induration, active/expressible drainage  MSK (spine):  -Strength exam      Left  Right  EHL    5/5  5/5 TA    5/5  5/5 GSC    5/5  5/5 Knee extension  5/5  5/5 Hip flexion   5/5  5/5  -Sensory exam    Sensation intact to light touch in L3-S1 nerve distributions of bilateral lower extremities  Imaging: None obtained today   Patient name: Rebecca Fox Patient MRN: [id number] Date of visit: 08/28/22

## 2022-08-28 NOTE — Telephone Encounter (Signed)
Patient called and to make sure that she can go back to work and that she can only lift 10 pounds. If so can you put this in the note to give to her job. CB#[phone]

## 2022-08-28 NOTE — Telephone Encounter (Signed)
Note written and sent to her MyChart per her request

## 2022-09-13 DIAGNOSIS — Z419 Encounter for procedure for purposes other than remedying health state, unspecified: Secondary | ICD-10-CM | POA: Diagnosis not present

## 2022-09-24 ENCOUNTER — Ambulatory Visit (INDEPENDENT_AMBULATORY_CARE_PROVIDER_SITE_OTHER): Payer: BLUE CROSS/BLUE SHIELD | Admitting: Orthopedic Surgery

## 2022-09-24 DIAGNOSIS — Z9889 Other specified postprocedural states: Secondary | ICD-10-CM

## 2022-09-24 NOTE — Progress Notes (Signed)
Orthopedic Surgery Post-operative Office Visit   Procedure: L5/S1 microdiscectomy Date of Surgery: 08/18/2022 (~6 weeks post-op)   Assessment: Patient is a 27 y.o. who is doing well after microdiscectomy     Plan: -Operative plans complete -Out of bed as tolerated, no brace -No activity restrictions at this time -Pain management: OTC medications -Return to office in 6 weeks, lumbar x-rays needed at next visit: AP/lateral/flex/ex lumbar   ___________________________________________________________________________     Subjective: Patient has been doing well since surgery.  She is not having any pain in her back or pain radiating into her lower extremity.  She has not noticed any redness or drainage around her incision.  She is back to work but has been limiting her lifting due to recent surgery.  She is about to go on a cruise and is looking forward to that.  Denies paresthesias and numbness.    Objective:   General: no acute distress, appropriate affect Neurologic: alert, answering questions appropriately, following commands Respiratory: unlabored breathing on room air Skin: incision is well healed   MSK (spine):   -Strength exam                                                   Left                  Right   EHL                              5/5                  5/5 TA                                 5/5                  5/5 GSC                             5/5                  5/5 Knee extension            5/5                  5/5 Hip flexion                    5/5                  5/5   -Sensory exam                           Sensation intact to light touch in L3-S1 nerve distributions of bilateral lower extremities   Imaging: None obtained today     Patient name: Rebecca Fox Patient MRN: [id number] Date of visit: 09/24/22

## 2022-10-13 DIAGNOSIS — Z419 Encounter for procedure for purposes other than remedying health state, unspecified: Secondary | ICD-10-CM | POA: Diagnosis not present

## 2022-11-05 ENCOUNTER — Ambulatory Visit: Payer: BLUE CROSS/BLUE SHIELD | Admitting: Orthopedic Surgery

## 2022-11-11 ENCOUNTER — Ambulatory Visit (INDEPENDENT_AMBULATORY_CARE_PROVIDER_SITE_OTHER): Payer: BLUE CROSS/BLUE SHIELD | Admitting: Orthopedic Surgery

## 2022-11-11 ENCOUNTER — Other Ambulatory Visit (INDEPENDENT_AMBULATORY_CARE_PROVIDER_SITE_OTHER): Payer: BLUE CROSS/BLUE SHIELD

## 2022-11-11 DIAGNOSIS — Z9889 Other specified postprocedural states: Secondary | ICD-10-CM

## 2022-11-11 NOTE — Progress Notes (Signed)
Orthopedic Surgery Post-operative Office Visit   Procedure: L5/S1 right-sided hemilaminotomy and microdiscectomy Date of Surgery: 08/18/2022 (~3 months post-op)   Assessment: Patient is a 27 y.o. whose right side radicular symptoms have resolved but now has developed new left-sided radicular symptoms within the last 2 weeks     Plan: -Operative plans complete -Out of bed as tolerated, no brace -No activity restrictions at this time -Pain management: OTC medications -In regards to her new pain in the left lower extremity, I discussed several conservative treatments that she could try.  She did not want to try any medications and wanted to try getting back into working out.  I told her that was okay and if she wanted to try any medicines we talked about, we could start those in the future -I explained that most radicular type pains do tend to get better with time so we will continue to monitor -Return to office in 3 months, lumbar x-rays needed at next visit: none   ___________________________________________________________________________     Subjective: Patient has developed left leg symptoms within the last 2 weeks.  She says she has mild pain and some paresthesias on the posterior aspect of her left thigh and leg.  She is not having any right-sided symptoms.  She is not having any back pain.  She does not recall any trauma or injury that preceded the onset of this left lower extremity pain.  She has not noticed any redness or drainage around her incision.   Objective:   General: no acute distress, appropriate affect Neurologic: alert, answering questions appropriately, following commands Respiratory: unlabored breathing on room air Skin: incision is well healed   MSK (spine):   -Strength exam                                                   Left                  Right   EHL                              5/5                  5/5 TA                                 5/5                   5/5 GSC                             5/5                  5/5 Knee extension            5/5                  5/5 Hip flexion                    5/5                  5/5   -Sensory exam  Sensation intact to light touch in L3-S1 nerve distributions of bilateral lower extremities   Imaging: XRs of the lumbar spine from 11/11/2022 were independently reviewed and interpreted, showing no significant degenerative changes.  No fracture or dislocation.  No evidence of instability on flexion/extension views.     Patient name: Rebecca Fox Patient MRN: [id number] Date of visit: 11/11/22

## 2022-11-13 DIAGNOSIS — Z419 Encounter for procedure for purposes other than remedying health state, unspecified: Secondary | ICD-10-CM | POA: Diagnosis not present

## 2022-11-25 ENCOUNTER — Other Ambulatory Visit: Payer: Self-pay

## 2022-12-13 DIAGNOSIS — Z419 Encounter for procedure for purposes other than remedying health state, unspecified: Secondary | ICD-10-CM | POA: Diagnosis not present

## 2023-01-13 DIAGNOSIS — Z419 Encounter for procedure for purposes other than remedying health state, unspecified: Secondary | ICD-10-CM | POA: Diagnosis not present

## 2023-01-18 ENCOUNTER — Ambulatory Visit: Payer: Self-pay | Admitting: Nurse Practitioner

## 2023-02-11 ENCOUNTER — Ambulatory Visit: Payer: BLUE CROSS/BLUE SHIELD | Admitting: Orthopedic Surgery

## 2023-02-11 DIAGNOSIS — Z9889 Other specified postprocedural states: Secondary | ICD-10-CM

## 2023-02-11 MED ORDER — GABAPENTIN 100 MG PO CAPS: 200.0000 mg | ORAL_CAPSULE | Freq: Three times a day (TID) | ORAL | 1 refills | Status: AC

## 2023-02-11 NOTE — Progress Notes (Signed)
Orthopedic Surgery Post-operative Office Visit   Procedure: L5/S1 right-sided hemilaminotomy and microdiscectomy Date of Surgery: 08/18/2022 (~6 months post-op)   Assessment: Patient is a 28 y.o. whose right side radicular symptoms have resolved but has had numbness/paresthesias in the posterior left leg and plantar foot for about a month     Plan: -Operative plans complete -Out of bed as tolerated, no brace -No activity restrictions at this time -Prescribed gabapentin for left-sided symptoms -If her symptoms are more bothersome or are persisting, will get MRI to evaluate further -Return to office in 6 weeks, x-rays needed at next visit: lumbar AP/lateral/flex/ex   ___________________________________________________________________________     Subjective: Patient is not having any right leg pain, numbness, or paresthesias.  However, for the past month she has had left leg numbness and paresthesias.  She feels it along the posterior aspect of the left leg and into the plantar aspect of the foot.  She does not notice it all the time.  She notices it mostly when she is standing at work.  It does resolve if she shakes it out.  She is not having any pain in that leg.  She has not noticed any redness or drainage around her incision.   Objective:   General: no acute distress, appropriate affect Neurologic: alert, answering questions appropriately, following commands Respiratory: unlabored breathing on room air Skin: incision is well healed   MSK (spine):   -Strength exam                                                   Left                  Right   EHL                              5/5                  5/5 TA                                 5/5                  5/5 GSC                             5/5                  5/5 Knee extension            5/5                  5/5 Hip flexion                    5/5                  5/5   -Sensory exam                           Sensation  intact to light touch in L3-S1 nerve distributions of bilateral lower extremities   Imaging: XRs of the lumbar spine from 11/11/2022 were previously independently reviewed and interpreted, showing no  significant degenerative changes.  No fracture or dislocation.  No evidence of instability on flexion/extension views.     Patient name: Rebecca Fox Patient MRN: [id number] Date of visit: 02/11/23

## 2023-02-13 DIAGNOSIS — Z419 Encounter for procedure for purposes other than remedying health state, unspecified: Secondary | ICD-10-CM | POA: Diagnosis not present

## 2023-03-13 DIAGNOSIS — Z419 Encounter for procedure for purposes other than remedying health state, unspecified: Secondary | ICD-10-CM | POA: Diagnosis not present

## 2023-03-25 ENCOUNTER — Ambulatory Visit: Payer: BLUE CROSS/BLUE SHIELD | Admitting: Orthopedic Surgery

## 2023-04-07 ENCOUNTER — Other Ambulatory Visit (INDEPENDENT_AMBULATORY_CARE_PROVIDER_SITE_OTHER)

## 2023-04-07 ENCOUNTER — Ambulatory Visit (INDEPENDENT_AMBULATORY_CARE_PROVIDER_SITE_OTHER): Admitting: Orthopedic Surgery

## 2023-04-07 DIAGNOSIS — Z9889 Other specified postprocedural states: Secondary | ICD-10-CM

## 2023-04-07 NOTE — Progress Notes (Signed)
 Orthopedic Surgery Post-operative Office Visit   Procedure: L5/S1 right-sided hemilaminotomy and microdiscectomy Date of Surgery: 08/18/2022 (~7 months post-op)   Assessment: Patient is a 28 y.o. who is doing well after surgery     Plan: -No spine specific precautions at this time -Since pain has gotten better, will not get any advanced imaging at this time -Pain control: OTC medications -Return to office on an as needed basis   ___________________________________________________________________________     Subjective: Patient has been doing well since she was last seen.  She is not having any pain radiating into the right lower extremity.  Her left leg pain, numbness, paresthesias has gotten significantly better.  She ended up not taking the gabapentin.  She only notices the left leg symptoms now if she is standing in 1 place for too long.  It gets better as soon as she starts walking around.  She otherwise does not notice any pain into either lower extremity.  Has not noticed any redness or drainage around her incision.   Objective:   General: no acute distress, appropriate affect Neurologic: alert, answering questions appropriately, following commands Respiratory: unlabored breathing on room air Skin: incision is well healed   MSK (spine):   -Strength exam                                                   Left                  Right   EHL                              5/5                  5/5 TA                                 5/5                  5/5 GSC                             5/5                  5/5 Knee extension            5/5                  5/5 Hip flexion                    5/5                  5/5   -Sensory exam                           Sensation intact to light touch in L3-S1 nerve distributions of bilateral lower extremities   Imaging: XRs of the lumbar spine from 04/07/2023 were independently reviewed and interpreted, showing no disc height loss at  L5/S1. No other significant degenerative changes seen.  No fracture or dislocation.  No evidence of instability on flexion/extension views.     Patient name: Rebecca Fox Patient MRN: [id number]  Date of visit: 04/07/23

## 2023-04-24 DIAGNOSIS — Z419 Encounter for procedure for purposes other than remedying health state, unspecified: Secondary | ICD-10-CM | POA: Diagnosis not present

## 2023-05-24 DIAGNOSIS — Z419 Encounter for procedure for purposes other than remedying health state, unspecified: Secondary | ICD-10-CM | POA: Diagnosis not present

## 2023-06-24 DIAGNOSIS — Z419 Encounter for procedure for purposes other than remedying health state, unspecified: Secondary | ICD-10-CM | POA: Diagnosis not present

## 2023-07-24 DIAGNOSIS — Z419 Encounter for procedure for purposes other than remedying health state, unspecified: Secondary | ICD-10-CM | POA: Diagnosis not present

## 2023-08-24 DIAGNOSIS — Z419 Encounter for procedure for purposes other than remedying health state, unspecified: Secondary | ICD-10-CM | POA: Diagnosis not present

## 2023-09-24 DIAGNOSIS — Z419 Encounter for procedure for purposes other than remedying health state, unspecified: Secondary | ICD-10-CM | POA: Diagnosis not present

## 2023-10-24 DIAGNOSIS — Z419 Encounter for procedure for purposes other than remedying health state, unspecified: Secondary | ICD-10-CM | POA: Diagnosis not present

## 2023-11-15 ENCOUNTER — Encounter: Payer: Self-pay | Admitting: Radiology
